# Patient Record
Sex: Female | Born: 1935 | Race: White | Hispanic: No | Marital: Single | Smoking: Former smoker
Health system: Southern US, Community
[De-identification: ages and names within clinical notes are randomized; demographics above are authoritative.]

## PROBLEM LIST (undated history)

## (undated) DIAGNOSIS — E559 Vitamin D deficiency, unspecified: Secondary | ICD-10-CM

## (undated) DIAGNOSIS — F0390 Unspecified dementia without behavioral disturbance: Secondary | ICD-10-CM

## (undated) DIAGNOSIS — M1990 Unspecified osteoarthritis, unspecified site: Secondary | ICD-10-CM

## (undated) DIAGNOSIS — F329 Major depressive disorder, single episode, unspecified: Secondary | ICD-10-CM

## (undated) DIAGNOSIS — Z96659 Presence of unspecified artificial knee joint: Secondary | ICD-10-CM

## (undated) DIAGNOSIS — N390 Urinary tract infection, site not specified: Secondary | ICD-10-CM

---

## 2012-08-10 ENCOUNTER — Other Ambulatory Visit: Payer: Self-pay | Admitting: Geriatric Medicine

## 2012-08-10 LAB — URINALYSIS, COMPLETE
Bilirubin,UR: NEGATIVE
Blood: NEGATIVE
Glucose,UR: NEGATIVE mg/dL (ref 0–75)
Ketone: NEGATIVE
Protein: NEGATIVE
RBC,UR: 1 /HPF (ref 0–5)
Specific Gravity: 1.021 (ref 1.003–1.030)
Squamous Epithelial: 1
WBC UR: 2 /HPF (ref 0–5)

## 2012-08-10 LAB — CBC WITH DIFFERENTIAL/PLATELET
HCT: 35 % (ref 35.0–47.0)
Lymphocyte #: 2.2 10*3/uL (ref 1.0–3.6)
Lymphocyte %: 18.6 %
MCH: 26.2 pg (ref 26.0–34.0)
MCHC: 30.8 g/dL — ABNORMAL LOW (ref 32.0–36.0)
MCV: 85 fL (ref 80–100)
Monocyte #: 1 x10 3/mm — ABNORMAL HIGH (ref 0.2–0.9)
Monocyte %: 8.2 %
Neutrophil %: 67.9 %
RDW: 18.7 % — ABNORMAL HIGH (ref 11.5–14.5)
WBC: 11.8 10*3/uL — ABNORMAL HIGH (ref 3.6–11.0)

## 2012-08-10 LAB — BASIC METABOLIC PANEL
Anion Gap: 12 (ref 7–16)
BUN: 29 mg/dL — ABNORMAL HIGH (ref 7–18)
Chloride: 105 mmol/L (ref 98–107)
Co2: 25 mmol/L (ref 21–32)
Creatinine: 0.8 mg/dL (ref 0.60–1.30)
EGFR (African American): >60
Glucose: 140 mg/dL — ABNORMAL HIGH (ref 65–99)
Osmolality: 291 (ref 275–301)

## 2012-08-14 LAB — URINE CULTURE

## 2014-09-26 ENCOUNTER — Other Ambulatory Visit: Payer: Self-pay | Admitting: Family Medicine

## 2015-01-08 ENCOUNTER — Other Ambulatory Visit
Admission: RE | Admit: 2015-01-08 | Discharge: 2015-01-08 | Disposition: A | Discharge status: Home / Self Care | Payer: Medicare Other | Source: Other Acute Inpatient Hospital | Attending: Family Medicine | Admitting: Family Medicine

## 2015-01-08 DIAGNOSIS — R079 Chest pain, unspecified: Secondary | ICD-10-CM | POA: Diagnosis present

## 2015-01-08 LAB — COMPREHENSIVE METABOLIC PANEL
ALBUMIN: 3.4 g/dL — AB (ref 3.5–5.0)
ALK PHOS: 106 U/L (ref 38–126)
ALT: 10 U/L — ABNORMAL LOW (ref 14–54)
AST: 22 U/L (ref 15–41)
Anion gap: 12 (ref 5–15)
BILIRUBIN TOTAL: 0.8 mg/dL (ref 0.3–1.2)
BUN: 32 mg/dL — AB (ref 6–20)
CO2: 25 mmol/L (ref 22–32)
CREATININE: 0.85 mg/dL (ref 0.44–1.00)
Calcium: 9.1 mg/dL (ref 8.9–10.3)
Chloride: 108 mmol/L (ref 101–111)
GFR calc Af Amer: >60 mL/min (ref >60)
GFR calc non Af Amer: 59 mL/min — ABNORMAL LOW (ref >60)
Glucose, Bld: 207 mg/dL — ABNORMAL HIGH (ref 65–99)
POTASSIUM: 2.7 mmol/L — AB (ref 3.5–5.1)
Sodium: 145 mmol/L (ref 135–145)
Total Protein: 7 g/dL (ref 6.5–8.1)

## 2015-01-08 LAB — URINALYSIS COMPLETE WITH MICROSCOPIC (ARMC ONLY)
Bilirubin Urine: NEGATIVE
GLUCOSE, UA: 50 mg/dL — AB
Ketones, ur: NEGATIVE mg/dL
Nitrite: NEGATIVE
PH: 6 (ref 5.0–8.0)
Protein, ur: 100 mg/dL — AB
SPECIFIC GRAVITY, URINE: 1.028 (ref 1.005–1.030)
SQUAMOUS EPITHELIAL / LPF: NONE SEEN

## 2015-01-08 LAB — CBC WITH DIFFERENTIAL/PLATELET
Basophils Absolute: 0 10*3/uL (ref 0–0.1)
Basophils Relative: 0 %
EOS PCT: 0 %
Eosinophils Absolute: 0 10*3/uL (ref 0–0.7)
HEMATOCRIT: 40.5 % (ref 35.0–47.0)
HEMOGLOBIN: 13.1 g/dL (ref 12.0–16.0)
Lymphocytes Relative: 5 %
Lymphs Abs: 0.7 10*3/uL — ABNORMAL LOW (ref 1.0–3.6)
MCH: 28.6 pg (ref 26.0–34.0)
MCHC: 32.4 g/dL (ref 32.0–36.0)
MCV: 88.4 fL (ref 80.0–100.0)
MONO ABS: 0.5 10*3/uL (ref 0.2–0.9)
Monocytes Relative: 3 %
NEUTROS PCT: 92 %
Neutro Abs: 14.1 10*3/uL — ABNORMAL HIGH (ref 1.4–6.5)
Platelets: 246 10*3/uL (ref 150–440)
RBC: 4.58 MIL/uL (ref 3.80–5.20)
RDW: 16.1 % — ABNORMAL HIGH (ref 11.5–14.5)
WBC: 15.4 10*3/uL — ABNORMAL HIGH (ref 3.6–11.0)

## 2015-01-08 LAB — CK: Total CK: 24 U/L — ABNORMAL LOW (ref 38–234)

## 2015-01-08 LAB — CKMB (ARMC ONLY): CK, MB: 12.6 ng/mL — AB (ref 0.5–5.0)

## 2015-01-08 LAB — TROPONIN I: Troponin I: 0.12 ng/mL — ABNORMAL HIGH (ref <0.031)

## 2015-01-09 ENCOUNTER — Other Ambulatory Visit
Admission: RE | Admit: 2015-01-09 | Discharge: 2015-01-09 | Disposition: A | Discharge status: Home / Self Care | Payer: Medicare Other | Source: Ambulatory Visit | Attending: Family Medicine | Admitting: Family Medicine

## 2015-01-09 DIAGNOSIS — R079 Chest pain, unspecified: Secondary | ICD-10-CM | POA: Insufficient documentation

## 2015-01-09 LAB — COMPREHENSIVE METABOLIC PANEL
ALBUMIN: 3 g/dL — AB (ref 3.5–5.0)
ALK PHOS: 97 U/L (ref 38–126)
ALT: 9 U/L — AB (ref 14–54)
ANION GAP: 12 (ref 5–15)
AST: 18 U/L (ref 15–41)
BUN: 29 mg/dL — AB (ref 6–20)
CHLORIDE: 111 mmol/L (ref 101–111)
CO2: 24 mmol/L (ref 22–32)
Calcium: 8.8 mg/dL — ABNORMAL LOW (ref 8.9–10.3)
Creatinine, Ser: 0.72 mg/dL (ref 0.44–1.00)
GFR calc Af Amer: >60 mL/min (ref >60)
GFR calc non Af Amer: >60 mL/min (ref >60)
GLUCOSE: 152 mg/dL — AB (ref 65–99)
Potassium: 3.1 mmol/L — ABNORMAL LOW (ref 3.5–5.1)
Sodium: 147 mmol/L — ABNORMAL HIGH (ref 135–145)
TOTAL PROTEIN: 6.5 g/dL (ref 6.5–8.1)
Total Bilirubin: 0.8 mg/dL (ref 0.3–1.2)

## 2015-01-09 LAB — CBC WITH DIFFERENTIAL/PLATELET
Basophils Absolute: 0 10*3/uL (ref 0–0.1)
Basophils Relative: 0 %
EOS PCT: 0 %
Eosinophils Absolute: 0 10*3/uL (ref 0–0.7)
HCT: 39.1 % (ref 35.0–47.0)
Hemoglobin: 12.7 g/dL (ref 12.0–16.0)
Lymphocytes Relative: 8 %
Lymphs Abs: 1.2 10*3/uL (ref 1.0–3.6)
MCH: 28.6 pg (ref 26.0–34.0)
MCHC: 32.6 g/dL (ref 32.0–36.0)
MCV: 87.6 fL (ref 80.0–100.0)
MONO ABS: 0.8 10*3/uL (ref 0.2–0.9)
Monocytes Relative: 5 %
NEUTROS PCT: 87 %
Neutro Abs: 13.7 10*3/uL — ABNORMAL HIGH (ref 1.4–6.5)
PLATELETS: 272 10*3/uL (ref 150–440)
RBC: 4.46 MIL/uL (ref 3.80–5.20)
RDW: 16 % — AB (ref 11.5–14.5)
WBC: 15.7 10*3/uL — AB (ref 3.6–11.0)

## 2015-01-09 LAB — TROPONIN I: TROPONIN I: 0.1 ng/mL — AB (ref <0.031)

## 2015-01-14 LAB — URINE CULTURE: Culture: >=100000

## 2015-09-19 ENCOUNTER — Inpatient Hospital Stay
Admission: EM | Admit: 2015-09-19 | Discharge: 2015-09-24 | DRG: 871 | Disposition: A | Discharge status: Hospice | Payer: Medicare Other | Attending: Internal Medicine | Admitting: Internal Medicine

## 2015-09-19 ENCOUNTER — Inpatient Hospital Stay: Payer: Medicare Other

## 2015-09-19 ENCOUNTER — Emergency Department: Payer: Medicare Other

## 2015-09-19 DIAGNOSIS — Z7401 Bed confinement status: Secondary | ICD-10-CM | POA: Diagnosis not present

## 2015-09-19 DIAGNOSIS — G92 Toxic encephalopathy: Secondary | ICD-10-CM | POA: Diagnosis present

## 2015-09-19 DIAGNOSIS — R4182 Altered mental status, unspecified: Secondary | ICD-10-CM | POA: Insufficient documentation

## 2015-09-19 DIAGNOSIS — N170 Acute kidney failure with tubular necrosis: Secondary | ICD-10-CM | POA: Diagnosis present

## 2015-09-19 DIAGNOSIS — F329 Major depressive disorder, single episode, unspecified: Secondary | ICD-10-CM | POA: Diagnosis present

## 2015-09-19 DIAGNOSIS — E860 Dehydration: Secondary | ICD-10-CM | POA: Diagnosis present

## 2015-09-19 DIAGNOSIS — E876 Hypokalemia: Secondary | ICD-10-CM | POA: Diagnosis present

## 2015-09-19 DIAGNOSIS — J690 Pneumonitis due to inhalation of food and vomit: Secondary | ICD-10-CM | POA: Diagnosis present

## 2015-09-19 DIAGNOSIS — E43 Unspecified severe protein-calorie malnutrition: Secondary | ICD-10-CM | POA: Diagnosis present

## 2015-09-19 DIAGNOSIS — L8990 Pressure ulcer of unspecified site, unspecified stage: Secondary | ICD-10-CM | POA: Insufficient documentation

## 2015-09-19 DIAGNOSIS — X58XXXA Exposure to other specified factors, initial encounter: Secondary | ICD-10-CM | POA: Diagnosis present

## 2015-09-19 DIAGNOSIS — A419 Sepsis, unspecified organism: Secondary | ICD-10-CM | POA: Diagnosis present

## 2015-09-19 DIAGNOSIS — Z6825 Body mass index (BMI) 25.0-25.9, adult: Secondary | ICD-10-CM

## 2015-09-19 DIAGNOSIS — Z515 Encounter for palliative care: Secondary | ICD-10-CM | POA: Diagnosis not present

## 2015-09-19 DIAGNOSIS — I248 Other forms of acute ischemic heart disease: Secondary | ICD-10-CM | POA: Diagnosis present

## 2015-09-19 DIAGNOSIS — F0390 Unspecified dementia without behavioral disturbance: Secondary | ICD-10-CM | POA: Diagnosis present

## 2015-09-19 DIAGNOSIS — I4891 Unspecified atrial fibrillation: Secondary | ICD-10-CM | POA: Diagnosis not present

## 2015-09-19 DIAGNOSIS — Z87440 Personal history of urinary (tract) infections: Secondary | ICD-10-CM | POA: Diagnosis not present

## 2015-09-19 DIAGNOSIS — Z66 Do not resuscitate: Secondary | ICD-10-CM | POA: Diagnosis present

## 2015-09-19 DIAGNOSIS — E870 Hyperosmolality and hypernatremia: Secondary | ICD-10-CM | POA: Diagnosis present

## 2015-09-19 DIAGNOSIS — R1310 Dysphagia, unspecified: Secondary | ICD-10-CM | POA: Diagnosis present

## 2015-09-19 DIAGNOSIS — R609 Edema, unspecified: Secondary | ICD-10-CM

## 2015-09-19 DIAGNOSIS — J189 Pneumonia, unspecified organism: Secondary | ICD-10-CM

## 2015-09-19 DIAGNOSIS — Y929 Unspecified place or not applicable: Secondary | ICD-10-CM

## 2015-09-19 DIAGNOSIS — Z882 Allergy status to sulfonamides status: Secondary | ICD-10-CM

## 2015-09-19 DIAGNOSIS — Z96651 Presence of right artificial knee joint: Secondary | ICD-10-CM | POA: Diagnosis present

## 2015-09-19 DIAGNOSIS — Z886 Allergy status to analgesic agent status: Secondary | ICD-10-CM

## 2015-09-19 DIAGNOSIS — Z87891 Personal history of nicotine dependence: Secondary | ICD-10-CM

## 2015-09-19 DIAGNOSIS — I480 Paroxysmal atrial fibrillation: Secondary | ICD-10-CM | POA: Diagnosis present

## 2015-09-19 DIAGNOSIS — J9601 Acute respiratory failure with hypoxia: Secondary | ICD-10-CM | POA: Diagnosis present

## 2015-09-19 DIAGNOSIS — R6520 Severe sepsis without septic shock: Secondary | ICD-10-CM | POA: Diagnosis present

## 2015-09-19 DIAGNOSIS — S60221A Contusion of right hand, initial encounter: Secondary | ICD-10-CM | POA: Diagnosis present

## 2015-09-19 HISTORY — DX: Unspecified osteoarthritis, unspecified site: M19.90

## 2015-09-19 HISTORY — DX: Unspecified dementia, unspecified severity, without behavioral disturbance, psychotic disturbance, mood disturbance, and anxiety: F03.90

## 2015-09-19 HISTORY — DX: Urinary tract infection, site not specified: N39.0

## 2015-09-19 HISTORY — DX: Major depressive disorder, single episode, unspecified: F32.9

## 2015-09-19 HISTORY — DX: Vitamin D deficiency, unspecified: E55.9

## 2015-09-19 HISTORY — DX: Presence of unspecified artificial knee joint: Z96.659

## 2015-09-19 LAB — RAPID INFLUENZA A&B ANTIGENS
Influenza A (ARMC): NEGATIVE
Influenza B (ARMC): NEGATIVE

## 2015-09-19 LAB — URINALYSIS COMPLETE WITH MICROSCOPIC (ARMC ONLY)
Bilirubin Urine: NEGATIVE
Glucose, UA: 50 mg/dL — AB
Leukocytes, UA: NEGATIVE
Nitrite: NEGATIVE
PH: 5 (ref 5.0–8.0)
Specific Gravity, Urine: 1.039 — ABNORMAL HIGH (ref 1.005–1.030)

## 2015-09-19 LAB — BLOOD GAS, VENOUS
ACID-BASE EXCESS: 7.5 mmol/L — AB (ref 0.0–3.0)
BICARBONATE: 32 meq/L — AB (ref 21.0–28.0)
FIO2: 100
O2 SAT: 88.4 %
PCO2 VEN: 43 mmHg — AB (ref 44.0–60.0)
PH VEN: 7.48 — AB (ref 7.320–7.430)
PO2 VEN: 51 mmHg — AB (ref 31.0–45.0)
Patient temperature: 37

## 2015-09-19 LAB — CBC
HCT: 42.4 % (ref 35.0–47.0)
Hemoglobin: 14 g/dL (ref 12.0–16.0)
MCH: 27.8 pg (ref 26.0–34.0)
MCHC: 33.2 g/dL (ref 32.0–36.0)
MCV: 83.7 fL (ref 80.0–100.0)
Platelets: 284 10*3/uL (ref 150–440)
RBC: 5.06 MIL/uL (ref 3.80–5.20)
RDW: 18 % — AB (ref 11.5–14.5)
WBC: 18.4 10*3/uL — AB (ref 3.6–11.0)

## 2015-09-19 LAB — COMPREHENSIVE METABOLIC PANEL
ALBUMIN: 2.7 g/dL — AB (ref 3.5–5.0)
ALT: 15 U/L (ref 14–54)
AST: 27 U/L (ref 15–41)
Alkaline Phosphatase: 124 U/L (ref 38–126)
Anion gap: 13 (ref 5–15)
BUN: 49 mg/dL — ABNORMAL HIGH (ref 6–20)
CALCIUM: 9.1 mg/dL (ref 8.9–10.3)
CO2: 30 mmol/L (ref 22–32)
Chloride: 113 mmol/L — ABNORMAL HIGH (ref 101–111)
Creatinine, Ser: 1.1 mg/dL — ABNORMAL HIGH (ref 0.44–1.00)
GFR, EST AFRICAN AMERICAN: 50 mL/min — AB (ref >60)
GFR, EST NON AFRICAN AMERICAN: 43 mL/min — AB (ref >60)
Glucose, Bld: 198 mg/dL — ABNORMAL HIGH (ref 65–99)
POTASSIUM: 3.1 mmol/L — AB (ref 3.5–5.1)
Sodium: 156 mmol/L — ABNORMAL HIGH (ref 135–145)
TOTAL PROTEIN: 7.1 g/dL (ref 6.5–8.1)
Total Bilirubin: 1.1 mg/dL (ref 0.3–1.2)

## 2015-09-19 LAB — MRSA PCR SCREENING: MRSA by PCR: NEGATIVE

## 2015-09-19 LAB — TROPONIN I
Troponin I: 0.21 ng/mL — ABNORMAL HIGH (ref <0.031)
Troponin I: 0.19 ng/mL — ABNORMAL HIGH (ref <0.031)
Troponin I: 0.26 ng/mL — ABNORMAL HIGH (ref <0.031)

## 2015-09-19 LAB — LACTIC ACID, PLASMA
LACTIC ACID, VENOUS: 2.5 mmol/L — AB (ref 0.5–2.0)
Lactic Acid, Venous: 2.7 mmol/L (ref 0.5–2.0)

## 2015-09-19 MED ORDER — ONDANSETRON HCL 4 MG/2ML IJ SOLN: 4.0000 mg | Freq: Four times a day (QID) | INTRAMUSCULAR | Status: DC | PRN

## 2015-09-19 MED ORDER — ACETAMINOPHEN 325 MG PO TABS
650.0000 mg | ORAL_TABLET | Freq: Four times a day (QID) | ORAL | Status: DC | PRN
Start: 2015-09-19 — End: 2015-09-24
  Administered 2015-09-21: 650 mg via ORAL
  Filled 2015-09-19: qty 2

## 2015-09-19 MED ORDER — SODIUM CHLORIDE 0.9 % IV BOLUS (SEPSIS)
1000.0000 mL | INTRAVENOUS | Status: AC
  Administered 2015-09-19: 1000 mL via INTRAVENOUS

## 2015-09-19 MED ORDER — VANCOMYCIN HCL IN DEXTROSE 1-5 GM/200ML-% IV SOLN
1000.0000 mg | Freq: Once | INTRAVENOUS | Status: AC
  Administered 2015-09-19: 1000 mg via INTRAVENOUS
  Filled 2015-09-19: qty 200

## 2015-09-19 MED ORDER — DEXTROSE 5 % IV SOLN
2.0000 g | INTRAVENOUS | Status: DC
  Administered 2015-09-19 – 2015-09-20 (×2): 2 g via INTRAVENOUS
  Filled 2015-09-19 (×3): qty 2

## 2015-09-19 MED ORDER — VANCOMYCIN HCL IN DEXTROSE 750-5 MG/150ML-% IV SOLN
750.0000 mg | INTRAVENOUS | Status: DC
  Administered 2015-09-19 – 2015-09-20 (×2): 750 mg via INTRAVENOUS
  Filled 2015-09-19 (×3): qty 150

## 2015-09-19 MED ORDER — ACETAMINOPHEN 650 MG RE SUPP
650.0000 mg | Freq: Four times a day (QID) | RECTAL | Status: DC | PRN
  Administered 2015-09-19: 650 mg via RECTAL
  Filled 2015-09-19: qty 1

## 2015-09-19 MED ORDER — HEPARIN SODIUM (PORCINE) 5000 UNIT/ML IJ SOLN
5000.0000 [IU] | Freq: Three times a day (TID) | INTRAMUSCULAR | Status: DC
  Administered 2015-09-19 – 2015-09-21 (×5): 5000 [IU] via SUBCUTANEOUS
  Filled 2015-09-19 (×4): qty 1

## 2015-09-19 MED ORDER — ONDANSETRON HCL 4 MG PO TABS: 4.0000 mg | ORAL_TABLET | Freq: Four times a day (QID) | ORAL | Status: DC | PRN

## 2015-09-19 MED ORDER — CETYLPYRIDINIUM CHLORIDE 0.05 % MT LIQD
7.0000 mL | Freq: Two times a day (BID) | OROMUCOSAL | Status: DC
  Administered 2015-09-20 – 2015-09-23 (×6): 7 mL via OROMUCOSAL

## 2015-09-19 MED ORDER — PIPERACILLIN-TAZOBACTAM 3.375 G IVPB 30 MIN
3.3750 g | Freq: Once | INTRAVENOUS | Status: AC
  Administered 2015-09-19: 3.375 g via INTRAVENOUS
  Filled 2015-09-19: qty 50

## 2015-09-19 MED ORDER — SODIUM CHLORIDE 0.9 % IV BOLUS (SEPSIS)
1000.0000 mL | Freq: Once | INTRAVENOUS | Status: AC
  Administered 2015-09-19: 1000 mL via INTRAVENOUS

## 2015-09-19 MED ORDER — POTASSIUM CL IN DEXTROSE 5% 20 MEQ/L IV SOLN
20.0000 meq | INTRAVENOUS | Status: DC
  Administered 2015-09-19 – 2015-09-20 (×3): 20 meq via INTRAVENOUS
  Filled 2015-09-19 (×7): qty 1000

## 2015-09-19 MED ORDER — CHLORHEXIDINE GLUCONATE 0.12 % MT SOLN
15.0000 mL | Freq: Two times a day (BID) | OROMUCOSAL | Status: DC
  Administered 2015-09-19 – 2015-09-23 (×7): 15 mL via OROMUCOSAL
  Filled 2015-09-19 (×5): qty 15

## 2015-09-19 NOTE — Progress Notes (Signed)
eLink Physician-Brief Progress Note Patient Name: Delray Altnita M Pittsley DOB: 22-Apr-1925 MRN: [id number]   Date of Service  09/19/2015  HPI/Events of Note  Lactic Acid = 2.5.  eICU Interventions  Will bolus with 0.9 NaCl 1 liter IV over 1 hour now.      Intervention Category Major Interventions: Acid-Base disturbance - evaluation and management  Aidric Endicott Eugene 09/19/2015, 7:59 PM

## 2015-09-19 NOTE — ED Notes (Signed)
Pt came to ED from [facility]. Altered mental status since this morning. Staff at [facility] reports pt has dementia but is normally alert and able to talk. Pt has strong smell of ammonia and staff reports she has frequent UTIs.

## 2015-09-19 NOTE — ED Notes (Signed)
Pts family called this RN into the room. Pts family reported that they forgot that the patient was allergic to penicillin. MD notified.

## 2015-09-19 NOTE — ED Provider Notes (Signed)
[facility] Emergency Department Provider Note  ____________________________________________  Time seen: On arrival  I have reviewed the triage vital signs and the nursing notes.   HISTORY  Chief Complaint Shortness of Breath  History severely limited due to dementia  HPI Destiny Gutierrez is a 82 y.o. female who presents with reported shortness of breath and apparent altered mental status. She is from [facility]. Apparently at baseline she is alert and talkative. Today she is not responding and is ill-appearing     Past Medical History  Diagnosis Date  . Dementia   . Vitamin D deficiency   . MDD (major depressive disorder) (HCC)   . UTI (lower urinary tract infection)     There are no active problems to display for this patient.   No past surgical history on file.  Current Outpatient Rx  Name  Route  Sig  Dispense  Refill  . acetaminophen (TYLENOL) 325 MG tablet   Oral   Take 650 mg by mouth every 6 (six) hours as needed.         . Eyelid Cleansers (SYSTANE LID WIPES EX)   Apply externally   Apply 1 application topically 2 (two) times daily.         Marland Kitchen. FLUoxetine (PROZAC) 10 MG capsule   Oral   Take 10 mg by mouth daily.         Marland Kitchen. ipratropium-albuterol (DUONEB) 0.5-2.5 (3) MG/3ML SOLN   Nebulization   Take 3 mLs by nebulization every 6 (six) hours.         Marland Kitchen. loratadine (CLARITIN) 10 MG tablet   Oral   Take 10 mg by mouth daily.         . Melatonin 3 MG TABS   Oral   Take 3 mg by mouth at bedtime.         Marland Kitchen. tobramycin (TOBREX) 0.3 % ophthalmic solution      1 drop every 4 (four) hours as needed.         . tobramycin-dexamethasone (TOBRADEX) ophthalmic ointment      1 application every 12 (twelve) hours as needed.           Allergies Codeine; Oxycodone; and Sulfa antibiotics  No family history on file.  Social History Social History  Substance Use Topics  . Smoking status: Unknown If Ever Smoked  .  Smokeless tobacco: None  . Alcohol Use: No    Level V caveat: Unable to obtain Review of Systems due to altered mental status      ____________________________________________   PHYSICAL EXAM:  VITAL SIGNS: BP 130/87 mmHg  Pulse 31  Temp(Src) 101.5 F (38.6 C)  Resp 27  Ht 5\' 6"  (1.676 m)  Wt 63.7 kg  BMI 22.68 kg/m2  SpO2 95%                                                           --     Constitutional: Withdraws from painful stimuli. Ill-appearing Eyes: PERRLA ENT   Head: Normocephalic and atraumatic.   Mouth/Throat: Mucous membranes are dry Cardiovascular: Tachycardic, regular rhythm. Normal and symmetric distal pulses are present in all extremities.  Respiratory: Increased respiratory effort. Rales bilaterally Gastrointestinal: No distention. There is no CVA tenderness. Genitourinary: deferred Musculoskeletal:  Bilateral lower extremity  edema, mild Neurologic: Nonresponsive to questions Skin:  Skin is warm, dry and intact. No rash noted. Psychiatric: Unable to examine  ____________________________________________    LABS (pertinent positives/negatives)  Labs Reviewed  CBC - Abnormal; Notable for the following:    WBC 18.4 (*)    RDW 18.0 (*)    All other components within normal limits  COMPREHENSIVE METABOLIC PANEL - Abnormal; Notable for the following:    Sodium 156 (*)    Potassium 3.1 (*)    Chloride 113 (*)    Glucose, Bld 198 (*)    BUN 49 (*)    Creatinine, Ser 1.10 (*)    Albumin 2.7 (*)    GFR calc non Af Amer 43 (*)    GFR calc Af Amer 50 (*)    All other components within normal limits  TROPONIN I - Abnormal; Notable for the following:    Troponin I 0.21 (*)    All other components within normal limits  LACTIC ACID, PLASMA - Abnormal; Notable for the following:    Lactic Acid, Venous 2.7 (*)    All other components within normal limits  URINALYSIS COMPLETEWITH MICROSCOPIC ([facility] ONLY) - Abnormal; Notable for  the following:    Color, Urine AMBER (*)    APPearance HAZY (*)    Glucose, UA 50 (*)    Ketones, ur TRACE (*)    Specific Gravity, Urine 1.039 (*)    Hgb urine dipstick 1+ (*)    Protein, ur >500 (*)    Bacteria, UA RARE (*)    Squamous Epithelial / LPF 0-5 (*)    All other components within normal limits  BLOOD GAS, VENOUS - Abnormal; Notable for the following:    pH, Ven 7.48 (*)    pCO2, Ven 43 (*)    pO2, Ven 51.0 (*)    Bicarbonate 32.0 (*)    Acid-Base Excess 7.5 (*)    All other components within normal limits  RAPID INFLUENZA A&B ANTIGENS ([facility] ONLY)  CULTURE, BLOOD (ROUTINE X 2)  CULTURE, BLOOD (ROUTINE X 2)  URINE CULTURE  LACTIC ACID, PLASMA    ____________________________________________   EKG  ED ECG REPORT I, Jene Every, the attending physician, personally viewed and interpreted this ECG.   Date: 09/19/2015  EKG Time: 2:43 PM  Rate: 109  Rhythm: sinus tachycardia  Axis: Right axis deviation  Intervals:right bundle branch block  ST&T Change: Nonspecific   ____________________________________________    RADIOLOGY I have personally reviewed any xrays that were ordered on this patient: Chest x-ray concerning for right middle lobe pneumonia  ____________________________________________   PROCEDURES  Procedure(s) performed: none  Critical Care performed: yes CRITICAL CARE Performed by: Jene Every   Total critical care time: 30 minutes  Critical care time was exclusive of separately billable procedures and treating other patients.  Critical care was necessary to treat or prevent imminent or life-threatening deterioration.  Critical care was time spent personally by me on the following activities: development of treatment plan with patient and/or surrogate as well as nursing, discussions with consultants, evaluation of patient's response to treatment, examination of patient, obtaining history from patient or surrogate, ordering and  performing treatments and interventions, ordering and review of laboratory studies, ordering and review of radiographic studies, pulse oximetry and re-evaluation of patient's condition.   ____________________________________________   INITIAL IMPRESSION / ASSESSMENT AND PLAN / ED COURSE  Pertinent labs & imaging results that were available during my care of the patient were reviewed by me and  considered in my medical decision making (see chart for details).  Patient has a MOST form however apparently the family has instructed [facility] to send the patient to the ED for "treatment". As such we will obtain labs and a chest x-ray and urinalysis and reevaluate. My sense is that patient is likely septic but unclear how aggressive to be.  ----------------------------------------- 2:47 PM on 09/19/2015 -----------------------------------------  Discussed with patient's daughter and POA, they note patient is DO NOT RESUSCITATE and DO NOT INTUBATE but they are okay with oxygen fluids and antibiotics. They also note the patient has a current femur fracture    Patient with elevated white blood cell count, hypernatremia, elevated lactic acid and x-ray suspicious for pneumonia. Her troponin is elevated at 0.21. She is receiving vancomycin and Zosyn for sepsis  ____________________________________________   FINAL CLINICAL IMPRESSION(S) / ED DIAGNOSES  Final diagnoses:  Sepsis, due to unspecified organism [facility])     Jene Every, MD 09/19/15 1545

## 2015-09-19 NOTE — Progress Notes (Signed)
   09/19/15 2200  Clinical Encounter Type  Visited With Patient and family together  Visit Type Initial;Spiritual support  Referral From Nurse  Spiritual Encounters  Spiritual Needs Prayer  Stress Factors  Patient Stress Factors Health changes  Family requested prayer with patient and family. Chaplain engaged family with prayer and support. Maisie Fushomas

## 2015-09-19 NOTE — H&P (Signed)
[facility] [place] at [facility]   PATIENT NAME: Destiny Gutierrez    MR#:  [id number]  DATE OF BIRTH:  1925-07-04  DATE OF ADMISSION:  09/19/2015  PRIMARY CARE PHYSICIAN: Dorothey BasemanAVID BRONSTEIN, MD   REQUESTING/REFERRING PHYSICIAN: Dr. Jene Everyobert Kinner  CHIEF COMPLAINT:   Chief Complaint  Patient presents with  . Shortness of Breath    HISTORY OF PRESENT ILLNESS:  Destiny Gutierrez  is a 82 y.o. female with a known history of dementia with bedbound status, history of depression, history of previous UTI, osteoarthritis, status post right knee replacement, recent fall with a right hip fracture treated nonoperatively who presents to the hospital due to altered mental status and a cough. Patient herself has baseline dementia and is currently altered and therefore most of the history obtained from the daughters at bedside and also from the chart. Patient was in her usual state of health until this morning she was noted to be more lethargic and altered and also noted to have a cough with upper airway rattling sounds are noted to be hypoxic. She was sent to the ER for further evaluation noted to be febrile, tachycardic, and noted to have an elevated lactic acid consistent with sepsis. Given her upper airway respiratory symptoms she was thought to have a pneumonia and hospitalist services were contacted further treatment and evaluation.  PAST MEDICAL HISTORY:   Past Medical History  Diagnosis Date  . Dementia   . Vitamin D deficiency   . MDD (major depressive disorder) (HCC)   . UTI (lower urinary tract infection)   . Osteoarthritis   . Knee joint replacement status     PAST SURGICAL HISTORY:  No past surgical history on file.  SOCIAL HISTORY:   Social History  Substance Use Topics  . Smoking status: Former Smoker -- 1.00 packs/day for 20 years    Types: Cigarettes  . Smokeless tobacco: Not on file  . Alcohol Use: No    FAMILY HISTORY:   Family History  Problem  Relation Age of Onset  . Dementia Mother   . Alcoholism Father     DRUG ALLERGIES:   Allergies  Allergen Reactions  . Codeine   . Oxycodone   . Sulfa Antibiotics     REVIEW OF SYSTEMS:   Review of Systems  Unable to perform ROS: dementia    MEDICATIONS AT HOME:   Prior to Admission medications   Medication Sig Start Date End Date Taking? Authorizing Provider  acetaminophen (TYLENOL) 325 MG tablet Take 650 mg by mouth every 6 (six) hours as needed.   Yes Historical Provider, MD  Eyelid Cleansers (SYSTANE LID WIPES EX) Apply 1 application topically 2 (two) times daily.   Yes Historical Provider, MD  FLUoxetine (PROZAC) 10 MG capsule Take 10 mg by mouth daily.   Yes Historical Provider, MD  ipratropium-albuterol (DUONEB) 0.5-2.5 (3) MG/3ML SOLN Take 3 mLs by nebulization every 6 (six) hours.   Yes Historical Provider, MD  loratadine (CLARITIN) 10 MG tablet Take 10 mg by mouth daily.   Yes Historical Provider, MD  Melatonin 3 MG TABS Take 3 mg by mouth at bedtime.   Yes Historical Provider, MD  tobramycin (TOBREX) 0.3 % ophthalmic solution 1 drop every 4 (four) hours as needed.   Yes Historical Provider, MD  tobramycin-dexamethasone [facility](TOBRADEX) ophthalmic ointment 1 application every 12 (twelve) hours as needed.   Yes Historical Provider, MD      VITAL SIGNS:  Blood pressure 130/87, pulse 31, temperature 101.5  F (38.6 C), resp. rate 27, height  (1.676 m), weight 63.7 kg (140 lb 6.9 oz), SpO2 95 %.  PHYSICAL EXAMINATION:  Physical Exam  GENERAL:  82 y.o.-year-old patient lying in the bed encephalopathic and in mild respiratory distress.  EYES: Pupils equal, round, reactive to light. No scleral icterus. HEENT: Head atraumatic, normocephalic.No oropharyngeal erythema, Dry oral mucosa  NECK:  Supple, no jugular venous distention. No thyroid enlargement, no tenderness.  LUNGS: Bilateral upper airway rhonchi, positive accessory muscles. No dullness to percussion. No wheezing,  rales. CARDIOVASCULAR: S1, S2 RRR. No murmurs, rubs, gallops, clicks.  ABDOMEN: Soft, nontender, nondistended. Bowel sounds present. No organomegaly or mass.  EXTREMITIES: No pedal edema, cyanosis, or clubbing. + 2 pedal & radial pulses b/l.   NEUROLOGIC: Globally weak and encephalopathic. Difficult to do a full neurological exam presently. PSYCHIATRIC: Globally weak and encephalopathic.  SKIN: No obvious rash, lesion, or ulcer.   LABORATORY PANEL:   CBC  Recent Labs Lab 09/19/15 1430  WBC 18.4*  HGB 14.0  HCT 42.4  PLT 284   ------------------------------------------------------------------------------------------------------------------  Chemistries   Recent Labs Lab 09/19/15 1430  NA 156*  K 3.1*  CL 113*  CO2 30  GLUCOSE 198*  BUN 49*  CREATININE 1.10*  CALCIUM 9.1  AST 27  ALT 15  ALKPHOS 124  BILITOT 1.1   ------------------------------------------------------------------------------------------------------------------  Cardiac Enzymes  Recent Labs Lab 09/19/15 1430  TROPONINI 0.21*   ------------------------------------------------------------------------------------------------------------------  RADIOLOGY:  Dg Chest Portable 1 View  09/19/2015  CLINICAL DATA:  Altered mental status. EXAM: PORTABLE CHEST 1 VIEW COMPARISON:  None FINDINGS: No pneumothorax. The heart, hila, and mediastinum are unremarkable given portable technique. No pulmonary nodules, masses, or infiltrates. IMPRESSION: No active disease. Electronically Signed   By: Gerome Sam III M.D   On: 09/19/2015 15:49     IMPRESSION AND PLAN:   82 year old female with past medical history of dementia, osteoarthritis, history of right knee replacement, who presents to the hospital due to altered mental status and in acute respiratory failure.  #1 altered mental status-metabolic encephalopathy secondary to the sepsis, hypernatremia -I will treat underlying sepsis with IV antibiotics,  correct sodium with D5W. Follow mental status. -I will get a CT head.  #2 sepsis-patient meets criteria given elevated lactic acid, tachycardia, and upper airway respiratory symptoms consistent with pneumonia. -IV fluids, broad-spectrum IV antibiotics with vancomycin and Zosyn. Follow blood, sputum cultures.  #3 pneumonia-cause of patient's sepsis. -Suspected aspiration pneumonia. Continue vancomycin, Zosyn. Follow sputum cultures. Speech eval when mental status is improved.  #4 hypernatremia - due to dehydration and poor by mouth intake. -I will start patient D5W and follow sodium.  #5 acute renal failure-due to dehydration and poor by mouth intake and also underlying sepsis. -Hydrate with IV fluids, follow BUN/creatinine.  #6 leukocytosis-due to underlying infection. Follow white cell count with IV antibiotic therapy.  Patient's prognosis is guarded/poor discussed this with the patient's family at bedside. If patient is not improving would consider getting a palliative care consult.  All the records are reviewed and case discussed with ED provider. Management plans discussed with the patient, family and they are in agreement.  CODE STATUS: DNR  TOTAL TIME TAKING CARE OF THIS PATIENT: 50 minutes.    Houston Siren M.D on 09/19/2015 at 4:21 PM  Between 7am to 6pm - Pager - [phone]  After 6pm go to [email or web address] - password [email or web address] [facility]  [facility]  Office  [phone]  CC: Primary care physician; Dorothey Baseman, MD

## 2015-09-19 NOTE — ED Notes (Signed)
Pt taken off non rebreather and placed on 4 L [place] per MD.

## 2015-09-19 NOTE — Progress Notes (Signed)
Pharmacy Antibiotic Note  Destiny Gutierrez is a 82 y.o. female admitted on 09/19/2015 with sepsis.  Pharmacy has been consulted for vancomycin & cefepime dosing. Patient received one dose of vancomycin and piperacillin/tazobactam in the ED.   Plan: Vancomycin 750 IV every 24 hours.  Goal trough 15-20 mcg/mL. Patient received 1000 mg dose in ED. Stacked dose to begin 8 hour after initial dose.  Vancomycin trough scheduled for 3/13 at 2230, which is prior to the 4th dose.   Kinetics: Ke: 0.030 Half-life: 23 hours Vd: 47 L Cmin ~15.5 mcg/mL calculated estimate  Cefepime 2 g IV daily per renal function and indication  Height: 5\' 5"  (165.1 cm) Weight: 148 lb 2.4 oz (67.2 kg) IBW/kg (Calculated) : 57  Temp (24hrs), Avg:101.1 F (38.4 C), Min:100.5 F (38.1 C), Max:101.5 F (38.6 C)   Recent Labs Lab 09/19/15 1430 09/19/15 1726  WBC 18.4*  --   CREATININE 1.10*  --   LATICACIDVEN 2.7* 2.5*    Estimated Creatinine Clearance: 31.2 mL/min (by C-G formula based on Cr of 1.1).    Allergies  Allergen Reactions  . Codeine   . Oxycodone   . Penicillins   . Sulfa Antibiotics     Antimicrobials this admission: Vancomycin 3/11 >>  cefepime 3/11 >>   Dose adjustments this admission: n/a  Microbiology results: 3/11 BCx: Sent 3/11 UCx: Sent from catheter  3/11 MRSA PCR: negative  Thank you for allowing pharmacy to be a part of this patient's care.  Destiny Gutierrez, PharmD Clinical Pharmacist 09/19/2015 7:22 PM

## 2015-09-19 NOTE — ED Notes (Signed)
Pt transported to CT ?

## 2015-09-19 NOTE — ED Notes (Signed)
Critical labs troponin 0.21 and lactic acid 2.7. MD notified.

## 2015-09-19 NOTE — Progress Notes (Signed)
Called Dr. Anne HahnWillis to report central tele monitoring called to report pt had a 12 beat run nonsustained VT, and troponin came back at 0.26. Will continue to monitor. Pt coughing on an off, non responsive. Family at bedside. Just received a second liter of NS. Had first in ED.

## 2015-09-19 NOTE — Progress Notes (Signed)
Notified E Link about patient's second lactic acid of 2.5 (first 2.7). E Link RN to notify [place]ade NurseE Link MD.

## 2015-09-20 ENCOUNTER — Inpatient Hospital Stay: Payer: Medicare Other

## 2015-09-20 ENCOUNTER — Inpatient Hospital Stay (HOSPITAL_COMMUNITY)
Admit: 2015-09-20 | Discharge: 2015-09-20 | Disposition: A | Discharge status: Home / Self Care | Payer: Medicare Other | Attending: Internal Medicine | Admitting: Internal Medicine

## 2015-09-20 DIAGNOSIS — L8990 Pressure ulcer of unspecified site, unspecified stage: Secondary | ICD-10-CM | POA: Insufficient documentation

## 2015-09-20 DIAGNOSIS — I4891 Unspecified atrial fibrillation: Secondary | ICD-10-CM

## 2015-09-20 LAB — BLOOD CULTURE ID PANEL (REFLEXED)
Acinetobacter baumannii: NOT DETECTED
CANDIDA ALBICANS: NOT DETECTED
CANDIDA GLABRATA: NOT DETECTED
CANDIDA KRUSEI: NOT DETECTED
CANDIDA PARAPSILOSIS: NOT DETECTED
CANDIDA TROPICALIS: NOT DETECTED
Carbapenem resistance: NOT DETECTED
ENTEROBACTER CLOACAE COMPLEX: NOT DETECTED
ESCHERICHIA COLI: NOT DETECTED
Enterobacteriaceae species: NOT DETECTED
Enterococcus species: NOT DETECTED
Haemophilus influenzae: NOT DETECTED
KLEBSIELLA PNEUMONIAE: NOT DETECTED
Klebsiella oxytoca: NOT DETECTED
Listeria monocytogenes: NOT DETECTED
Methicillin resistance: DETECTED — AB
Neisseria meningitidis: NOT DETECTED
PROTEUS SPECIES: NOT DETECTED
Pseudomonas aeruginosa: NOT DETECTED
SERRATIA MARCESCENS: NOT DETECTED
STREPTOCOCCUS PNEUMONIAE: NOT DETECTED
Staphylococcus aureus (BCID): NOT DETECTED
Staphylococcus species: DETECTED — AB
Streptococcus agalactiae: NOT DETECTED
Streptococcus pyogenes: NOT DETECTED
Streptococcus species: NOT DETECTED
Vancomycin resistance: NOT DETECTED

## 2015-09-20 LAB — URINALYSIS COMPLETE WITH MICROSCOPIC (ARMC ONLY)
BILIRUBIN URINE: NEGATIVE
GLUCOSE, UA: NEGATIVE mg/dL
Ketones, ur: NEGATIVE mg/dL
LEUKOCYTES UA: NEGATIVE
NITRITE: NEGATIVE
PH: 6 (ref 5.0–8.0)
Protein, ur: 100 mg/dL — AB
SPECIFIC GRAVITY, URINE: 1.028 (ref 1.005–1.030)
Squamous Epithelial / LPF: NONE SEEN

## 2015-09-20 LAB — BASIC METABOLIC PANEL
Anion gap: 9 (ref 5–15)
Anion gap: 7 (ref 5–15)
BUN: 41 mg/dL — ABNORMAL HIGH (ref 6–20)
BUN: 43 mg/dL — AB (ref 6–20)
CALCIUM: 8.7 mg/dL — AB (ref 8.9–10.3)
CHLORIDE: 115 mmol/L — AB (ref 101–111)
CHLORIDE: 116 mmol/L — AB (ref 101–111)
CO2: 27 mmol/L (ref 22–32)
CO2: 25 mmol/L (ref 22–32)
CREATININE: 0.62 mg/dL (ref 0.44–1.00)
CREATININE: 0.81 mg/dL (ref 0.44–1.00)
Calcium: 8.1 mg/dL — ABNORMAL LOW (ref 8.9–10.3)
GFR calc Af Amer: >60 mL/min (ref >60)
GFR calc non Af Amer: >60 mL/min (ref >60)
GFR calc non Af Amer: >60 mL/min (ref >60)
GLUCOSE: 173 mg/dL — AB (ref 65–99)
GLUCOSE: 236 mg/dL — AB (ref 65–99)
POTASSIUM: 2.8 mmol/L — AB (ref 3.5–5.1)
Potassium: 3.5 mmol/L (ref 3.5–5.1)
SODIUM: 150 mmol/L — AB (ref 135–145)
Sodium: 149 mmol/L — ABNORMAL HIGH (ref 135–145)

## 2015-09-20 LAB — CBC
HEMATOCRIT: 37.3 % (ref 35.0–47.0)
Hemoglobin: 12.3 g/dL (ref 12.0–16.0)
MCH: 27.8 pg (ref 26.0–34.0)
MCHC: 32.9 g/dL (ref 32.0–36.0)
MCV: 84.3 fL (ref 80.0–100.0)
PLATELETS: 212 10*3/uL (ref 150–440)
RBC: 4.42 MIL/uL (ref 3.80–5.20)
RDW: 18 % — AB (ref 11.5–14.5)
WBC: 17 10*3/uL — AB (ref 3.6–11.0)

## 2015-09-20 LAB — TROPONIN I: Troponin I: 0.15 ng/mL — ABNORMAL HIGH (ref <0.031)

## 2015-09-20 LAB — MAGNESIUM: Magnesium: 2.9 mg/dL — ABNORMAL HIGH (ref 1.7–2.4)

## 2015-09-20 MED ORDER — TOBRAMYCIN-DEXAMETHASONE 0.3-0.1 % OP OINT
TOPICAL_OINTMENT | Freq: Two times a day (BID) | OPHTHALMIC | Status: DC
  Administered 2015-09-20 – 2015-09-24 (×7): via OPHTHALMIC
  Filled 2015-09-20: qty 3.5

## 2015-09-20 MED ORDER — MAGNESIUM SULFATE 2 GM/50ML IV SOLN
2.0000 g | Freq: Once | INTRAVENOUS | Status: AC
  Administered 2015-09-20: 2 g via INTRAVENOUS
  Filled 2015-09-20: qty 50

## 2015-09-20 MED ORDER — SODIUM CHLORIDE 0.9 % IV SOLN
1.0000 g | Freq: Once | INTRAVENOUS | Status: AC
  Administered 2015-09-20: 1 g via INTRAVENOUS
  Filled 2015-09-20: qty 10

## 2015-09-20 MED ORDER — POTASSIUM CHLORIDE 10 MEQ/100ML IV SOLN
10.0000 meq | INTRAVENOUS | Status: AC
  Administered 2015-09-20 (×4): 10 meq via INTRAVENOUS
  Filled 2015-09-20 (×4): qty 100

## 2015-09-20 MED ORDER — TOBRAMYCIN 0.3 % OP SOLN
1.0000 [drp] | OPHTHALMIC | Status: DC
  Administered 2015-09-20 – 2015-09-23 (×19): 1 [drp] via OPHTHALMIC
  Filled 2015-09-20: qty 5

## 2015-09-20 MED ORDER — METOPROLOL TARTRATE 1 MG/ML IV SOLN
5.0000 mg | Freq: Four times a day (QID) | INTRAVENOUS | Status: DC
  Administered 2015-09-21 – 2015-09-22 (×6): 5 mg via INTRAVENOUS
  Filled 2015-09-20 (×7): qty 5

## 2015-09-20 NOTE — Progress Notes (Signed)
Mrs. Destiny Gutierrez opens her eyes when spoken to and nods her head yes or no. She is a little more responsive than she was this mornig. Pt weaned off no rebreather and placed on 5l nasal cannula. O2 sats in the mid 90's. Foley in place. Urinalysis collected as ordered. Daughters at bedside. Transferred to room 101. Report given to nurse. Remains on telemetry

## 2015-09-20 NOTE — Progress Notes (Signed)
Pharmacy Antibiotic Note  Destiny Gutierrez is a 82 y.o. female admitted on 09/19/2015 with sepsis.  Pharmacy has been consulted for vancomycin & cefepime dosing. This is day #2 of antibiotics.   Plan: BCID results were discussed with Dr. Nemiah CommanderKalisetti and the patient's current antibiotics will be continued. Patient was admitted with sepsis. Blood culture was positive with staph species in one bottle, which will likely result as CoNS. Patient is currently on vancomycin. Per discussion with MD, will continue current antibiotics.   Height: 5\' 5"  (165.1 cm) Weight: 148 lb 2.4 oz (67.2 kg) IBW/kg (Calculated) : 57  Temp (24hrs), Avg:100.1 F (37.8 C), Min:98.4 F (36.9 C), Max:101.5 F (38.6 C)   Recent Labs Lab 09/19/15 1430 09/19/15 1726 09/20/15 0024 09/20/15 1045  WBC 18.4*  --  17.0*  --   CREATININE 1.10*  --  0.81 0.62  LATICACIDVEN 2.7* 2.5*  --   --     Estimated Creatinine Clearance: 42.9 mL/min (by C-G formula based on Cr of 0.62).    Allergies  Allergen Reactions  . Codeine   . Oxycodone   . Penicillins   . Sulfa Antibiotics     Antimicrobials this admission: cefepime 3/11 >>  vancomycin 3/11 >>   Dose adjustments this admission: n/a  Microbiology results: 3/11 BCx: staph species, mec A detected  Thank you for allowing pharmacy to be a part of this patient's care.  Cindi CarbonMary M Sarit Sparano, PharmD Clinical Pharmacist 09/20/2015 12:37 PM

## 2015-09-20 NOTE — Clinical Social Work Note (Signed)
Clinical Social Work Assessment  Patient Details  Name: Destiny Gutierrez MRN: [id number] Date of Birth: 06/03/1925  Date of referral:  09/20/15               Reason for consult:  Facility Placement, Other (Comment Required) (From [facility] (SNF, LTC) )                Permission sought to share information with:  Oceanographeracility Contact Representative Permission granted to share information::  Yes, Verbal Permission Granted  Name::       [facility]              Agency::   Skilled Nursing Facility LTC  Relationship::     Contact Information:     Housing/Transportation Living arrangements for the past 2 months:  Skilled Building surveyorursing Facility Source of Information:  Power of [place], Adult Children Patient Interpreter Needed:  None Criminal Activity/Legal Involvement Pertinent to Current Situation/Hospitalization:  No - Comment as needed Significant Relationships:  Adult Children Lives with:  Facility Resident Do you feel safe going back to the place where you live?    Need for family participation in patient care:  Yes (Comment)  Care giving concerns:  Patient is a long term care resident at [facility].   Social Worker assessment / plan:  Visual merchandiserClinical Social Worker (CSW) received consult that patient is from a facility. CSW noted in ED notes that patient came from [facility]. CSW contacted [employer] [facility] liaison. Per Jomarie LongsJoseph patient is a long term care resident and can return to [facility] when stable for D/C. Per chart patient is not alert and oriented. CSW contacted patient's daughter Destiny Gutierrez. Per Destiny Gutierrez her sister Destiny Gutierrez is HPOA. Daughters Destiny Gutierrez and [place]. Per Destiny Gutierrez patient has been at [facility] for over 1 year now. Per daughters patient is total care, wheel chair bound and staff at [facility] utilize a hoyer lift. Per daughter patient is a DNR and they do not want feeding tube or intubation. Daughters are agreeable for patient to return to [facility] when medically stable.  FL2 complete and faxed out. CSW  will continue to follow and assist as needed.    Employment status:  Disabled (Comment on whether or not currently receiving Disability), Retired Health and safety inspectornsurance information:  Medicare, [place] PT Recommendations:  Not assessed at this time Information / Referral to community resources:  Skilled Nursing Facility  Patient/Family's Response to care:  Patient's daughters are agreeable for patient to return to [facility].   Patient/Family's Understanding of and Emotional Response to Diagnosis, Current Treatment, and Prognosis:  Daughters were pleasant and thanked CSW for visit.   Emotional Assessment Appearance:    Attitude/Demeanor/Rapport:  Unable to Assess Affect (typically observed):  Unable to Assess Orientation:  Oriented to Self, Fluctuating Orientation (Suspected and/or reported Sundowners) Alcohol / Substance use:  Not Applicable Psych involvement (Current and /or in the community):  No (Comment)  Discharge Needs  Concerns to be addressed:  Discharge Planning Concerns Readmission within the last 30 days:  No Current discharge risk:  Chronically ill, Dependent with Mobility, Cognitively Impaired Barriers to Discharge:  Continued Medical Work up   Destiny Gutierrez, Destiny Lasser G, LCSW 09/20/2015, 10:01 AM

## 2015-09-20 NOTE — Progress Notes (Signed)
[facility] [place] [facility]   PATIENT NAME: Destiny Gutierrez    MR#:  [id number]  DATE OF BIRTH:  28-May-1925  SUBJECTIVE:  CHIEF COMPLAINT:   Chief Complaint  Patient presents with  . Shortness of Breath   - patient admitted with sepsis, possible aspiration pneumonia - on NRB with sats 100%, alert but not verbally responsive or following commands - has a congested cough  REVIEW OF SYSTEMS:  Review of Systems  Unable to perform ROS: dementia    DRUG ALLERGIES:   Allergies  Allergen Reactions  . Codeine   . Oxycodone   . Penicillins   . Sulfa Antibiotics     VITALS:  Blood pressure 124/79, pulse 111, temperature 98.5 F (36.9 C), temperature source Axillary, resp. rate 24, height 5\' 5"  (1.651 m), weight 67.2 kg (148 lb 2.4 oz), SpO2 98 %.  PHYSICAL EXAMINATION:  Physical Exam  GENERAL:  82 y.o.-year-old patient lying in the bed, critically ill appearing.  EYES: Pupils equal, round, reactive to light and accommodation. No scleral icterus. Extraocular muscles intact.  HEENT: Head atraumatic, normocephalic. Oropharynx and nasopharynx clear. Nonrebreather mask in place. NECK:  Supple, no jugular venous distention. No thyroid enlargement, no tenderness.  LUNGS: Coarse breath sounds bilaterally with scattered rhonchi. no wheezing, rales or crepitation. No use of accessory muscles of respiration.  CARDIOVASCULAR: S1, S2 normal. No rubs, or gallops. 3/6 systolic murmur is present ABDOMEN: Soft, nontender, nondistended. Bowel sounds present. No organomegaly or mass.  Foley catheter in place. EXTREMITIES: No pedal edema, cyanosis, or clubbing.  Right hand bruised and flexed at the wrist, left hand swollen. Right leg is internally rotated and shortened due to history of femoral fracture NEUROLOGIC: Remains encephalopathic. Eyes open but not following any commands at this time. PSYCHIATRIC: The patient is alert. Encephalopathic SKIN: No obvious  rash, lesion, or ulcer.    LABORATORY PANEL:   CBC  Recent Labs Lab 09/20/15 0024  WBC 17.0*  HGB 12.3  HCT 37.3  PLT 212   ------------------------------------------------------------------------------------------------------------------  Chemistries   Recent Labs Lab 09/19/15 1430 09/20/15 0024 09/20/15 0433  NA 156* 150*  --   K 3.1* 2.8*  --   CL 113* 116*  --   CO2 30 25  --   GLUCOSE 198* 236*  --   BUN 49* 43*  --   CREATININE 1.10* 0.81  --   CALCIUM 9.1 8.1*  --   MG  --   --  2.9*  AST 27  --   --   ALT 15  --   --   ALKPHOS 124  --   --   BILITOT 1.1  --   --    ------------------------------------------------------------------------------------------------------------------  Cardiac Enzymes  Recent Labs Lab 09/20/15 0024  TROPONINI 0.15*   ------------------------------------------------------------------------------------------------------------------  RADIOLOGY:  Ct Head Wo Contrast  09/19/2015  CLINICAL DATA:  Altered mental status EXAM: CT HEAD WITHOUT CONTRAST TECHNIQUE: Contiguous axial images were obtained from the base of the skull through the vertex without intravenous contrast. COMPARISON:  None. FINDINGS: There is atrophy and extensive chronic small vessel disease changes. No acute intracranial abnormality. Specifically, no hemorrhage, hydrocephalus, mass lesion, acute infarction, or significant intracranial injury. No acute calvarial abnormality. Mastoid air cells are clear. IMPRESSION: Extensive chronic small vessel disease. Diffuse cerebral atrophy. No acute intracranial abnormality. Electronically Signed   By: Charlett NoseKevin  Dover M.D.   On: 09/19/2015 17:18   Dg Chest Portable 1 View  09/19/2015  CLINICAL DATA:  Altered mental status. EXAM: PORTABLE CHEST 1 VIEW COMPARISON:  None FINDINGS: No pneumothorax. The heart, hila, and mediastinum are unremarkable given portable technique. No pulmonary nodules, masses, or infiltrates. IMPRESSION: No  active disease. Electronically Signed   By: Gerome Sam III M.D   On: 09/19/2015 15:49    EKG:   Orders placed or performed during the hospital encounter of 09/19/15  . ED EKG  . ED EKG  . EKG 12-Lead  . EKG 12-Lead    ASSESSMENT AND PLAN:   82 year old female with past medical history of dementia, osteoarthritis, history of right knee replacement, who presents to the hospital due to altered mental status and in acute respiratory failure.  #1 Metabolic encephalopathy secondary to the sepsis, hypernatremia -CT head with diffuse cortical atrophy- consistent with her dementia- no acute findings. - cont trt of sepsis with IV ABX and D5W for increased sodium - minimal improvement today - At baseline, patient is total care dependent, has dementia, but able to maintain conversation with family.  #2 sepsis-on admission, Likely from aspiration pneumonia - on 100% NRB- transition to nasal cannula today - cont vanc and zosyn. Blood and urine cultures are pending -IV fluids  #3 Tachycardia- afib with rvr- known h/o paroxysmal afib once in past - HR around 100- patient is NPO- prn IV pushes if needed - ECHO  #4 hypernatremia - due to dehydration and poor by mouth intake. -Improving sodium, cont D5W for now  #5 acute renal failure- ATN, due to dehydration and poor by mouth intake and also underlying sepsis. -Hydrate with IV fluids, follow BUN/creatinine.  #6 Hypokalemia- being replaced, monitor  #7 Right hand pain and bruise- f/u Xray  #8 DVT Prophylaxis- SQ Heparin  #9 Elevated troponin- demand ischemia, stable   Discussed in detail with daughter Claris Che over the phone  All the records are reviewed and case discussed with ED provider. Management plans discussed with the patient, family and they are in agreement.  CODE STATUS: DNR   All the records are reviewed and case discussed with Care Management/Social Workerr. Management plans discussed with the patient, family and  they are in agreement.   TOTAL CRITICAL CARE TIME SPENT IN TAKING CARE OF THIS PATIENT:  38 minutes.   POSSIBLE D/C IN 2-3 DAYS, DEPENDING ON CLINICAL CONDITION.   Enid Baas M.D on 09/20/2015 at 9:23 AM  Between 7am to 6pm - Pager - [phone]  After 6pm go to [email or web address] - [email or web address] [facility]  [facility]  Office  [phone]  CC: Primary care physician; Dorothey Baseman, MD

## 2015-09-20 NOTE — NC FL2 (Signed)
[place] MEDICAID FL2 LEVEL OF CARE SCREENING TOOL     IDENTIFICATION  Patient Name: Destiny Gutierrez Birthdate: 03/09/1925 Sex: female Admission Date (Current Location): 09/19/2015  [facility] [place]  [place]  ([id number]) Facility and Address:  [facility], [address], [place], [place] [address]      Provider Number: [id number]  Attending Physician Name and Address:  Enid Baasadhika Kalisetti, MD  Relative Name and Phone Number:       Current Level of Care: Hospital Recommended Level of Care: Skilled Nursing Facility Prior Approval Number:    Date Approved/Denied:   PASRR Number:  ( [id number] )  Discharge Plan: SNF    Current Diagnoses: Patient Active Problem List   Diagnosis Date Noted  . Sepsis (HCC) 09/19/2015    Orientation RESPIRATION BLADDER Height & Weight     Self  Other (Comment) (Non-rebreather mask) Incontinent Weight: 148 lb 2.4 oz (67.2 kg) Height:  5\' 5"  (165.1 cm)  BEHAVIORAL SYMPTOMS/MOOD NEUROLOGICAL BOWEL NUTRITION STATUS   (none )  (none ) Incontinent Diet (NPO)  AMBULATORY STATUS COMMUNICATION OF NEEDS Skin   Extensive Assist Verbally PU Stage and Appropriate Care (Pressure Ulcer Stage 1: Ischial Tuberosity (Left) )                       Personal Care Assistance Level of Assistance  Bathing, Feeding, Dressing Bathing Assistance: Limited assistance Feeding assistance: Independent Dressing Assistance: Limited assistance     Functional Limitations Info  Sight, Hearing, Speech Sight Info: Adequate Hearing Info: Adequate Speech Info: Adequate    SPECIAL CARE FACTORS FREQUENCY                       Contractures      Additional Factors Info  Code Status, Allergies Code Status Info:  (DNR ) Allergies Info:  (Codeine, Oxycodone, Penicillins, Sulfa Antibiotics)           Current Medications (09/20/2015):  This is the current hospital active medication list Current  Facility-Administered Medications  Medication Dose Route Frequency Provider Last Rate Last Dose  . acetaminophen (TYLENOL) tablet 650 mg  650 mg Oral Q6H PRN Houston SirenVivek J Sainani, MD       Or  . acetaminophen (TYLENOL) suppository 650 mg  650 mg Rectal Q6H PRN Houston SirenVivek J Sainani, MD   650 mg at 09/19/15 1836  . antiseptic oral rinse (CPC / CETYLPYRIDINIUM CHLORIDE 0.05%) solution 7 mL  7 mL Mouth Rinse q12n4p Houston SirenVivek J Sainani, MD      . ceFEPIme (MAXIPIME) 2 g in dextrose 5 % 50 mL IVPB  2 g Intravenous Q24H Cindi CarbonMary M Swayne, RPH   2 g at 09/19/15 2237  . chlorhexidine (PERIDEX) 0.12 % solution 15 mL  15 mL Mouth Rinse BID Houston SirenVivek J Sainani, MD   15 mL at 09/19/15 2239  . dextrose 5 % with KCl 20 mEq / L  infusion  20 mEq Intravenous Continuous Houston SirenVivek J Sainani, MD   Stopped at 09/20/15 0315  . heparin injection 5,000 Units  5,000 Units Subcutaneous 3 times per day Houston SirenVivek J Sainani, MD   5,000 Units at 09/20/15 0512  . ondansetron (ZOFRAN) tablet 4 mg  4 mg Oral Q6H PRN Houston SirenVivek J Sainani, MD       Or  . ondansetron (ZOFRAN) injection 4 mg  4 mg Intravenous Q6H PRN Houston SirenVivek J Sainani, MD      . vancomycin (VANCOCIN) IVPB 750 mg/150 ml  premix  750 mg Intravenous Q24H Cindi Carbon, RPH   750 mg at 09/19/15 2312     Discharge Medications: Please see discharge summary for a list of discharge medications.  Relevant Imaging Results:  Relevant Lab Results:   Additional Information  (SSN: [id number])  Haig Prophet, LCSW

## 2015-09-20 NOTE — Progress Notes (Signed)
*  PRELIMINARY RESULTS* Echocardiogram 2D Echocardiogram has been performed.  Destiny Gutierrez 09/20/2015, 3:57 PM

## 2015-09-20 NOTE — Plan of Care (Signed)
Problem: Physical Regulation: Goal: Ability to maintain clinical measurements within normal limits will improve Outcome: Progressing Pt transfered today from CCU. VSS. Continue O2 at 4L per Halma with O2 sats at 92%. Disoriented x4. Follow simple commands at times. Minimal speech. No signs of pain nor discomfort. Foley d/c at 1635.

## 2015-09-20 NOTE — Progress Notes (Signed)
Patient tx to 2A.

## 2015-09-20 NOTE — Progress Notes (Signed)
Called MD to inform about potassium being down to 2.8, troponin 0.15, central tele monitoring called again about run of VT. Dr. Sheryle Hailiamond to put in orders at this time.

## 2015-09-20 NOTE — Progress Notes (Signed)
MD notified patient had 6 runs of paroxysmal supraventricular tachycardia and oxygen saturations have been in the high 80s. Order for scheduled PRN IV metoprolol for rate control, order for non-rebreather, keep 02 above 90%.

## 2015-09-21 ENCOUNTER — Inpatient Hospital Stay: Payer: Medicare Other

## 2015-09-21 LAB — BASIC METABOLIC PANEL
Anion gap: 3 — ABNORMAL LOW (ref 5–15)
BUN: 37 mg/dL — AB (ref 6–20)
CALCIUM: 8.7 mg/dL — AB (ref 8.9–10.3)
CO2: 29 mmol/L (ref 22–32)
CREATININE: 0.63 mg/dL (ref 0.44–1.00)
Chloride: 116 mmol/L — ABNORMAL HIGH (ref 101–111)
GFR calc non Af Amer: >60 mL/min (ref >60)
Glucose, Bld: 138 mg/dL — ABNORMAL HIGH (ref 65–99)
Potassium: 3.7 mmol/L (ref 3.5–5.1)
SODIUM: 148 mmol/L — AB (ref 135–145)

## 2015-09-21 LAB — CBC
HCT: 36 % (ref 35.0–47.0)
Hemoglobin: 11.6 g/dL — ABNORMAL LOW (ref 12.0–16.0)
MCH: 27.5 pg (ref 26.0–34.0)
MCHC: 32.1 g/dL (ref 32.0–36.0)
MCV: 85.6 fL (ref 80.0–100.0)
Platelets: 189 10*3/uL (ref 150–440)
RBC: 4.2 MIL/uL (ref 3.80–5.20)
RDW: 18.7 % — AB (ref 11.5–14.5)
WBC: 21.5 10*3/uL — ABNORMAL HIGH (ref 3.6–11.0)

## 2015-09-21 LAB — ECHOCARDIOGRAM COMPLETE
HEIGHTINCHES: 65 in
Weight: 2370.39 oz

## 2015-09-21 LAB — URINE CULTURE: Culture: NO GROWTH

## 2015-09-21 MED ORDER — ENOXAPARIN SODIUM 40 MG/0.4ML ~~LOC~~ SOLN
40.0000 mg | SUBCUTANEOUS | Status: DC
  Administered 2015-09-21 – 2015-09-23 (×3): 40 mg via SUBCUTANEOUS
  Filled 2015-09-21 (×4): qty 0.4

## 2015-09-21 MED ORDER — SODIUM CHLORIDE 0.9 % IV SOLN
1.0000 g | Freq: Two times a day (BID) | INTRAVENOUS | Status: DC
  Administered 2015-09-21 – 2015-09-23 (×5): 1 g via INTRAVENOUS
  Filled 2015-09-21 (×7): qty 1

## 2015-09-21 NOTE — Progress Notes (Signed)
Pharmacy Antibiotic Note  Destiny Gutierrez is a [address] y.o. female admitted on 09/19/2015 with sepsis.  Pharmacy has been consulted for Meropenem dosing, patient with possible aspiration pneumonia as well, MD would like to provide anaerobic coverage.  Plan: Will order Meropenem 1g IV q12h, dose reduced for renal function.  Height: 5\' 5"  (165.1 cm) Weight: 152 lb 8 oz (69.174 kg) IBW/kg (Calculated) : 57  Temp (24hrs), Avg:98.4 F (36.9 C), Min:97.9 F (36.6 C), Max:98.8 F (37.1 C)   Recent Labs Lab 09/19/15 1430 09/19/15 1726 09/20/15 0024 09/20/15 1045 09/21/15 0437  WBC 18.4*  --  17.0*  --  21.5*  CREATININE 1.10*  --  0.81 0.62 0.63  LATICACIDVEN 2.7* 2.5*  --   --   --     Estimated Creatinine Clearance: 46.6 mL/min (by C-G formula based on Cr of 0.63).    Allergies  Allergen Reactions  . Codeine   . Oxycodone   . Penicillins   . Sulfa Antibiotics     Antimicrobials this admission: Vancomycin 3/11 >> 3/13 Cefepime 3/11 >> 3/13 Meropenem 3/13 >>  Dose adjustments this admission:  Microbiology results:  Thank you for allowing pharmacy to be a part of this patient's care.  Clovia CuffLisa Wister Hoefle, PharmD, BCPS 09/21/2015 1:36 PM

## 2015-09-21 NOTE — Care Management Important Message (Signed)
Important Message  Patient Details  Name: Destiny Gutierrez MRN: [id number] Date of Birth: [birth year]   Medicare Important Message Given:  Yes    Olegario MessierKathy A Jashawn Floyd 09/21/2015, 1:16 PM

## 2015-09-21 NOTE — Care Management (Signed)
[facility]  She is being treated for sepsis and was transferred from 1C to 2A 3/12 due to  heart rate and increased 02 requirements.  Concern for aspiration pneumonia.  She has dementia and currently a DNR.  She does not have record of frequent admissions or presentations to [facility] over the last six months . Patient may benefit from palliative care consult for goals of treatment

## 2015-09-21 NOTE — Progress Notes (Signed)
Pt transferred to room 251. Pt disoriented, VSS, no complaints at this time. Tele box verified with Marchelle FolksAmanda, NT, skin assessed with Serina CowperAlisa, RN. RN will continue to monitor and treat per MD orders. Syliva Overmanassie A Niquan Charnley, RN

## 2015-09-21 NOTE — Progress Notes (Signed)
Initial Nutrition Assessment   INTERVENTION:   Meals and Snacks: Cater to patient preferences; SLP evaluating. Per paper chart pt baseline diet order dysphagia I, nectar thick liquids Medical Food Supplement Therapy: will recommend Mighty Shakes on meal trays TID (each supplement provides approximately 300kcals and 9g protein), RD notes SLP ordered Magic Cup on lunch trays.   NUTRITION DIAGNOSIS:   Swallowing difficulty related to dysphagia as evidenced by  (baseline diet order; SLP following).  GOAL:   Patient will meet greater than or equal to 90% of their needs  MONITOR:   PO intake, Supplement acceptance, Diet advancement, Labs, Weight trends, I & O's  REASON FOR ASSESSMENT:   Malnutrition Screening Tool    ASSESSMENT:   Pt admitted from [facility] with AMS and difficulty breathing. Per MD note pt with sepsis, questionable aspiration pna with h/o dementia and recent right hip fracture.  Past Medical History  Diagnosis Date  . Dementia   . Vitamin D deficiency   . MDD (major depressive disorder) (HCC)   . UTI (lower urinary tract infection)   . Osteoarthritis   . Knee joint replacement status      Diet Order:  DIET - DYS 1 Room service appropriate?: No; Fluid consistency:: Nectar Thick    Current Nutrition: Pt NPO this am. Pt resting on visit.  Food/Nutrition-Related History: Pt's daughter report that pt was eating very well until this past Saturday, started not feeling well and not eating. Pt's daughter reports pt was eating pureed foods with nectar thick liquids at baseline.  Without prompting, pt's family report wanting to continue thickened liquids if able, pending SLP evaluation, as they report not wanting feeding tubes.    Scheduled Medications:  . antiseptic oral rinse  7 mL Mouth Rinse q12n4p  . ceFEPime (MAXIPIME) IV  2 g Intravenous Q24H  . chlorhexidine  15 mL Mouth Rinse BID  . enoxaparin (LOVENOX) injection  40 mg Subcutaneous Q24H  .  metoprolol  5 mg Intravenous 4 times per day  . tobramycin  1 drop Both Eyes 6 times per day  . tobramycin-dexamethasone   Both Eyes BID    Electrolyte/Renal Profile and Glucose Profile:   Recent Labs Lab 09/20/15 0024 09/20/15 0433 09/20/15 1045 09/21/15 0437  NA 150*  --  149* 148*  K 2.8*  --  3.5 3.7  CL 116*  --  115* 116*  CO2 25  --  27 29  BUN 43*  --  41* 37*  CREATININE 0.81  --  0.62 0.63  CALCIUM 8.1*  --  8.7* 8.7*  MG  --  2.9*  --   --   GLUCOSE 236*  --  173* 138*   Protein Profile:  Recent Labs Lab 09/19/15 1430  ALBUMIN 2.7*    Gastrointestinal Profile: Last BM: 09/21/2015 loose watery stool   Nutrition-Focused Physical Exam Findings:  Unable to complete Nutrition-Focused physical exam at this time.    Weight Change: Pt's family report pt had lost 13.5lbs when last weighed on 08/17/2015 and they believe pt has lost more weight. Per chart current weight of 152lbs (5% weight loss in one month)   Skin:   (Stage I Ischial Tuberosity pressure ulcer per RN documentation)   Height:   Ht Readings from Last 1 Encounters:  09/19/15  (1.651 m)    Weight:   Wt Readings from Last 1 Encounters:  09/21/15 152 lb 8 oz (69.174 kg)     BMI:  Body mass index is  25.38 kg/(m^2).  Estimated Nutritional Needs:   Kcal:  BEE: 1115kcals, TEE: (IF 1.1-1.3)(AF 1.2) 1473-1740kcals  Protein:  69-83g protein (1.0-1.2g/kg)  Fluid:  1725-206470mL of fluid (25-2130mL/kg)  EDUCATION NEEDS:   No education needs identified at this time   HIGH Care Level  Leda QuailAllyson Sandra Tellefsen, RD, LDN Pager [phone] Weekend/On-Call Pager [phone]

## 2015-09-21 NOTE — Evaluation (Signed)
Clinical/Bedside Swallow Evaluation Patient Details  Name: Destiny Gutierrez MRN: [id number] Date of Birth: [birth year]  Today's Date: 09/21/2015 Time: SLP Start Time (ACUTE ONLY): 1050 SLP Stop Time (ACUTE ONLY): 1150 SLP Time Calculation (min) (ACUTE ONLY): 60 min  Past Medical History:  Past Medical History  Diagnosis Date  . Dementia   . Vitamin D deficiency   . MDD (major depressive disorder) (HCC)   . UTI (lower urinary tract infection)   . Osteoarthritis   . Knee joint replacement status    Past Surgical History: No past surgical history on file. HPI:  Pt is a 82 y.o. female with a known history of Dementia with bedbound status, history of depression, history of previous UTIs, osteoarthritis, status post right knee replacement, recent fall with a right hip fracture treated nonoperatively who presents to the hospital due to altered mental status and a cough. Patient herself has baseline dementia and is currently altered and therefore most of the history obtained from the daughters at bedside and also from the chart. Patient was in her usual state of health until this morning she was noted to be more lethargic and altered and also noted to have a cough with upper airway rattling sounds are noted to be hypoxic. She was sent to the ER for further evaluation noted to be febrile, tachycardic, and noted to have an elevated lactic acid consistent with sepsis. Given her upper airway respiratory symptoms she was thought to have a pneumonia. Currently, pt is on increased O2 support(nonrebreather mask) and appears quite fatgiued w/ having to maintain an alert status; she often closed her eyes b/t trials. She does not feed self at baseline. Dtrs stated pt is usually a good eater and will drink the thickened liquids "much of the time".    Assessment / Plan / Recommendation Clinical Impression  Pt appeared to adequately tolerate trials of Nectar liquids via TSP and purees via 1/2 tsp amounts w/ no  immediate, overt s/s of aspiration and no decline in respiratory status. Pt was give rest breaks b/t trials in order to reduce effort of WOB; nonrebreather mask maintained b/t feeding of trials. Pt exhibited clear vocal quality when assessed x3 b/t trials. Pt appeared to fatigue after only a few tsp trials of each consistency. Pt required feeding (baseline). Pt appears at increased risk for aspiration at this time; pt does have a baseline dx of dysphagia and is on a [facility]ph[facility]ia diet at [facility]. Rec. a Dys. 1 w/ Nectar liquids diet w/ strict aspiration precautions and feeding assistance; aspiration precautions; Meds in puree - crushed as able. Feeding at all meals. NSG staff to monitor pt's respiratory status for po intake and hold any po's if too sleepy to safely eat/drink.     Aspiration Risk  Moderate aspiration risk    Diet Recommendation  Dys. 1 w/ Nectar consistency liquids; strict Aspiration precautions; feeding assistance - only feed when fully awake and alert.   Medication Administration: Crushed with puree    Other  Recommendations Recommended Consults:  (Dietician) Oral Care Recommendations: Oral care BID;Staff/trained caregiver to provide oral care Other Recommendations: Order thickener from pharmacy;Prohibited food (jello, ice cream, thin soups);Remove water pitcher   Follow up Recommendations  Skilled Nursing facility    Frequency and Duration min 3x week  2 weeks       Prognosis Prognosis for Safe Diet Advancement: Fair Barriers to Reach Goals: Cognitive deficits;Severity of deficits      Swallow Study   General Date of  Onset: 09/19/15 HPI: Pt is a 82 y.o. female with a known history of Dementia with bedbound status, history of depression, history of previous UTIs, osteoarthritis, status post right knee replacement, recent fall with a right hip fracture treated nonoperatively who presents to the hospital due to altered mental status and a cough. Patient herself has baseline  dementia and is currently altered and therefore most of the history obtained from the daughters at bedside and also from the chart. Patient was in her usual state of health until this morning she was noted to be more lethargic and altered and also noted to have a cough with upper airway rattling sounds are noted to be hypoxic. She was sent to the ER for further evaluation noted to be febrile, tachycardic, and noted to have an elevated lactic acid consistent with sepsis. Given her upper airway respiratory symptoms she was thought to have a pneumonia. Currently, pt is on increased O2 support(nonrebreather mask) and appears quite fatgiued w/ having to maintain an alert status; she often closed her eyes b/t trials. She does not feed self at baseline. Dtrs stated pt is usually a good eater and will drink the thickened liquids "much of the time".  Type of Study: Bedside Swallow Evaluation Previous Swallow Assessment: possibly ~1 yr ago Diet Prior to this Study: Dysphagia 1 (puree);Nectar-thick liquids (at NH for ~10 months) Temperature Spikes Noted: No (wbc 21.5) Respiratory Status: Non-rebreather (4-6 liters) History of Recent Intubation: No Behavior/Cognition: Lethargic/Drowsy;Confused;Requires cueing Oral Cavity Assessment: Dry Oral Care Completed by SLP: Recent completion by staff Oral Cavity - Dentition: Missing dentition Vision:  (n/a) Self-Feeding Abilities: Total assist Patient Positioning: Upright in bed Baseline Vocal Quality: Low vocal intensity (mumbled responses) Volitional Cough: Weak;Congested Volitional Swallow: Unable to elicit    Oral/Motor/Sensory Function Overall Oral Motor/Sensory Function: Generalized oral weakness (difficult to assess sec. to weakness and Cognition) Facial Symmetry: Within Functional Limits Lingual ROM: Within Functional Limits Lingual Symmetry: Within Functional Limits Mandible: Within Functional Limits   Ice Chips Ice chips: Within functional  limits Presentation: Spoon (2 trials; fed) Other Comments: adequate bolus management of trials   Thin Liquid Thin Liquid: Not tested Other Comments: not her baseline    Nectar Thick Nectar Thick Liquid: Within functional limits Presentation: Spoon (6 trials; fed) Other Comments: too drowsy toward end of trials   Honey Thick Honey Thick Liquid: Not tested   Puree Puree: Within functional limits Presentation: Spoon (4 trials; fed) Other Comments: too drowsy toward end of trials   Solid   GO   Solid: Not tested Other Comments: not her baseline         [name], MS, CCC-SLP  Aidynn Krenn 09/21/2015,2:25 PM

## 2015-09-21 NOTE — Progress Notes (Addendum)
[facility] Physicians - [place] at [facility]   PATIENT NAME: Destiny Gutierrez    MR#:  [id number]  DATE OF BIRTH:  December 21, 1924  SUBJECTIVE:  CHIEF COMPLAINT:   Chief Complaint  Patient presents with  . Shortness of Breath   - patient admitted with sepsis, possible aspiration pneumonia - on NRB with sats 100%, alert but not verbally responsive or following commands - has a congested cough  REVIEW OF SYSTEMS:  Review of Systems  Unable to perform ROS: dementia    DRUG ALLERGIES:   Allergies  Allergen Reactions  . Codeine   . Oxycodone   . Penicillins   . Sulfa Antibiotics     VITALS:  Blood pressure 140/74, pulse 74, temperature 98.7 F (37.1 C), temperature source Oral, resp. rate 20, height  (1.651 m), weight 69.174 kg (152 lb 8 oz), SpO2 98 %.  PHYSICAL EXAMINATION:  Physical Exam  GENERAL:  82 y.o.-year-old patient lying in the bed, critically ill appearing.  EYES: Pupils equal, round, reactive to light and accommodation. No scleral icterus. Extraocular muscles intact.  HEENT: Head atraumatic, normocephalic. Oropharynx and nasopharynx clear. Nonrebreather mask in place. NECK:  Supple, no jugular venous distention. No thyroid enlargement, no tenderness.  LUNGS: Coarse breath sounds bilaterally with scattered rhonchi. no wheezing, rales or crepitation. No use of accessory muscles of respiration.  CARDIOVASCULAR: S1, S2 normal. No rubs, or gallops. 3/6 systolic murmur is present ABDOMEN: Soft, nontender, nondistended. Bowel sounds present. No organomegaly or mass.  Foley catheter in place. EXTREMITIES: No pedal edema, cyanosis, or clubbing.  Right hand bruised and flexed at the wrist, left hand swollen. Right leg is internally rotated and shortened due to history of femoral fracture NEUROLOGIC: Remains encephalopathic. Eyes open but not following any commands at this time. PSYCHIATRIC: The patient is alert. Encephalopathic SKIN: No obvious rash,  lesion, or ulcer.    LABORATORY PANEL:   CBC  Recent Labs Lab 09/21/15 0437  WBC 21.5*  HGB 11.6*  HCT 36.0  PLT 189   ------------------------------------------------------------------------------------------------------------------  Chemistries   Recent Labs Lab 09/19/15 1430  09/20/15 0433  09/21/15 0437  NA 156*  < >  --   < > 148*  K 3.1*  < >  --   < > 3.7  CL 113*  < >  --   < > 116*  CO2 30  < >  --   < > 29  GLUCOSE 198*  < >  --   < > 138*  BUN 49*  < >  --   < > 37*  CREATININE 1.10*  < >  --   < > 0.63  CALCIUM 9.1  < >  --   < > 8.7*  MG  --   --  2.9*  --   --   AST 27  --   --   --   --   ALT 15  --   --   --   --   ALKPHOS 124  --   --   --   --   BILITOT 1.1  --   --   --   --   < > = values in this interval not displayed. ------------------------------------------------------------------------------------------------------------------  Cardiac Enzymes  Recent Labs Lab 09/20/15 0024  TROPONINI 0.15*   ------------------------------------------------------------------------------------------------------------------  RADIOLOGY:  Dg Chest 1 View  09/20/2015  CLINICAL DATA:  Shortness of breath, sepsis, possible aspiration pneumonia EXAM: CHEST 1 VIEW COMPARISON:  09/19/2015 FINDINGS: Mild  patchy right infrahilar opacity, increased, suspicious for pneumonia. No pleural effusion or pneumothorax. The heart is top-normal in size. Mild prominence of the right paratracheal stripe is favored to be vascular. IMPRESSION: Mild patchy right infrahilar opacity, increased, suspicious for pneumonia. Electronically Signed   By: Charline BillsSriyesh  Krishnan M.D.   On: 09/20/2015 10:40   Ct Head Wo Contrast  09/19/2015  CLINICAL DATA:  Altered mental status EXAM: CT HEAD WITHOUT CONTRAST TECHNIQUE: Contiguous axial images were obtained from the base of the skull through the vertex without intravenous contrast. COMPARISON:  None. FINDINGS: There is atrophy and extensive chronic  small vessel disease changes. No acute intracranial abnormality. Specifically, no hemorrhage, hydrocephalus, mass lesion, acute infarction, or significant intracranial injury. No acute calvarial abnormality. Mastoid air cells are clear. IMPRESSION: Extensive chronic small vessel disease. Diffuse cerebral atrophy. No acute intracranial abnormality. Electronically Signed   By: Charlett NoseKevin  Dover M.D.   On: 09/19/2015 17:18   Dg Chest Portable 1 View  09/19/2015  CLINICAL DATA:  Altered mental status. EXAM: PORTABLE CHEST 1 VIEW COMPARISON:  None FINDINGS: No pneumothorax. The heart, hila, and mediastinum are unremarkable given portable technique. No pulmonary nodules, masses, or infiltrates. IMPRESSION: No active disease. Electronically Signed   By: Gerome Samavid  Williams III M.D   On: 09/19/2015 15:49   Dg Hand Complete Right  09/20/2015  CLINICAL DATA:  Sepsis.  Swelling of the right hand. EXAM: RIGHT HAND - COMPLETE 3+ VIEW COMPARISON:  None. FINDINGS: An IV is present at the base of the first metacarpal. Oxygen saturation device is present at the distal ring finger. Moderate osteopenia is present. Soft tissue swelling is present over the dorsum of the hand. There is no underlying fracture or dislocation. No osseous destruction is present. Degenerative changes are present in the DIP joints and at the first [facility]. IMPRESSION: 1. Soft tissue swelling over the dorsum the hand at the MCP joints. 2. No acute or focal osseous abnormality subjacent. 3. Moderate osteoarthritic changes. Electronically Signed   By: Marin Robertshristopher  Mattern M.D.   On: 09/20/2015 10:38    EKG:   Orders placed or performed during the hospital encounter of 09/19/15  . ED EKG  . ED EKG  . EKG 12-Lead  . EKG 12-Lead    ASSESSMENT AND PLAN:   82 year old female with past medical history of dementia, osteoarthritis, history of right knee replacement, who presents to the hospital due to altered mental status and in acute respiratory  failure.  #1 Metabolic encephalopathy secondary to the sepsis, hypernatremia -CT head with diffuse cortical atrophy- consistent with her dementia- no acute findings. - cont trt of sepsis with IV ABX and D5W for increased sodium - Slowly improving. Patient is more alert and following simple commands today. - At baseline she is total care dependent, has dementia, but able to maintain conversation with family.  #2 sepsis-on admission, likely from aspiration pneumonia - on 100% NRB- unable to be weaned off - Acute hypoxic respiratory failure- aspiration pneumonia Repeat CXR today  - cont vanc and cefepime. Blood and urine cultures are negative so far. One set of blood cultures growing coagulate negative staph. Likely contaminant. We'll discontinue vancomycin Change cefepime to meropenem for anerobics  #3 acute hypoxic respiratory failure-likely from aspiration pneumonia. -Remains on 100% nonrebreather. Follow-up chest x-ray today. - Speech therapy consult requested. Change to meropenem to cover anerobics -Baseline she eats pured diet with nectar thick liquids.  #3 Tachycardia- afib with rvr- known h/o paroxysmal afib once in past -  HR around 100- patient is NPO- prn IV pushes if needed Start oral metoprolol once able to eat PO - ECHO normal EF 55%  #4 hypernatremia - due to dehydration and poor by mouth intake. -Improving sodium,  On D5W.  #5 Acute renal failure- ATN, due to dehydration and poor by mouth intake and also underlying sepsis. - Follow BUN/creatinine.  Improved.  #6 Hypokalemia- replaced, monitor  #7 Right hand pain and bruise- subcutaneous edema from trauma may be. But no fracture.  #8 DVT Prophylaxis- SQ Heparin  #9 Elevated troponin- demand ischemia, stable  Discussed in detail with daughters at bedside.  All the records are reviewed and case discussed with ED provider. Management plans discussed with the patient, family and they are in agreement.  CODE  STATUS: DNR   All the records are reviewed and case discussed with Care Management/Social Workerr. Management plans discussed with the patient, family and they are in agreement.   TOTAL TIME SPENT IN TAKING CARE OF THIS PATIENT:  38 minutes.   POSSIBLE D/C IN 2-3 DAYS, DEPENDING ON CLINICAL CONDITION.   Enid Baas M.D on 09/21/2015 at 12:13 PM  Between 7am to 6pm - Pager - [phone]  After 6pm go to [email or web address] - password [email or web address] [facility]  [place] [facility]  Office  [phone]  CC: Primary care physician; Dorothey Baseman, MD

## 2015-09-22 LAB — CBC
HEMATOCRIT: 34.6 % — AB (ref 35.0–47.0)
HEMOGLOBIN: 11.2 g/dL — AB (ref 12.0–16.0)
MCH: 27.3 pg (ref 26.0–34.0)
MCHC: 32.2 g/dL (ref 32.0–36.0)
MCV: 84.8 fL (ref 80.0–100.0)
Platelets: 198 10*3/uL (ref 150–440)
RBC: 4.09 MIL/uL (ref 3.80–5.20)
RDW: 18.4 % — ABNORMAL HIGH (ref 11.5–14.5)
WBC: 19.2 10*3/uL — AB (ref 3.6–11.0)

## 2015-09-22 LAB — BASIC METABOLIC PANEL
ANION GAP: 2 — AB (ref 5–15)
BUN: 29 mg/dL — AB (ref 6–20)
CHLORIDE: 114 mmol/L — AB (ref 101–111)
CO2: 28 mmol/L (ref 22–32)
Calcium: 8.3 mg/dL — ABNORMAL LOW (ref 8.9–10.3)
Creatinine, Ser: 0.51 mg/dL (ref 0.44–1.00)
GFR calc Af Amer: >60 mL/min (ref >60)
GFR calc non Af Amer: >60 mL/min (ref >60)
GLUCOSE: 115 mg/dL — AB (ref 65–99)
POTASSIUM: 3.6 mmol/L (ref 3.5–5.1)
Sodium: 144 mmol/L (ref 135–145)

## 2015-09-22 MED ORDER — DEXTROSE-NACL 5-0.45 % IV SOLN
INTRAVENOUS | Status: AC
  Administered 2015-09-22 – 2015-09-23 (×2): via INTRAVENOUS

## 2015-09-22 MED ORDER — FLUOXETINE HCL 10 MG PO CAPS
10.0000 mg | ORAL_CAPSULE | Freq: Every day | ORAL | Status: DC
  Administered 2015-09-22 – 2015-09-23 (×2): 10 mg via ORAL
  Filled 2015-09-22 (×2): qty 1

## 2015-09-22 MED ORDER — FLUCONAZOLE IN SODIUM CHLORIDE 100-0.9 MG/50ML-% IV SOLN
100.0000 mg | INTRAVENOUS | Status: DC
  Administered 2015-09-22 – 2015-09-23 (×2): 100 mg via INTRAVENOUS
  Filled 2015-09-22 (×3): qty 50

## 2015-09-22 MED ORDER — METOPROLOL TARTRATE 25 MG PO TABS
25.0000 mg | ORAL_TABLET | Freq: Three times a day (TID) | ORAL | Status: DC
  Administered 2015-09-22 – 2015-09-23 (×5): 25 mg via ORAL
  Filled 2015-09-22 (×6): qty 1

## 2015-09-22 NOTE — Care Management (Signed)
Patient is requiring increase on 02 demands and now is on high flow nasal cannula. Discussed the benefits of a palliative care consult (even though it is not available today) to address goals.  Patient is a DNR but respiratory status is on a decline today.

## 2015-09-22 NOTE — Progress Notes (Signed)
CSW was informed by RN in progression that patient will be switched to High Flow Nasal Cannula. Patient cannot discharge to [facility] on High Flow Nasal Cannula. Patient must be on Nasal Cannula 4 (L/min) or less at discharge. CSW will continue to follow and assist.  Woodroe Modehristina Constantina Laseter, MSW, LCSW-A Clinical Social Work Department [phone]

## 2015-09-22 NOTE — Progress Notes (Signed)
[facility] Physicians - [place] at [facility]   PATIENT NAME: Destiny Gutierrez    MR#:  [id number]  DATE OF BIRTH:  1924-08-19  SUBJECTIVE:  CHIEF COMPLAINT:   Chief Complaint  Patient presents with  . Shortness of Breath   - Patient remains on nonrebreather mask. Answering questions and alert but continues to be extremely weak. -Still has shallow rapid breathing at times.  REVIEW OF SYSTEMS:  Review of Systems  Unable to perform ROS: dementia    DRUG ALLERGIES:   Allergies  Allergen Reactions  . Codeine   . Oxycodone   . Penicillins   . Sulfa Antibiotics     VITALS:  Blood pressure 161/80, pulse 72, temperature 98.4 F (36.9 C), temperature source Oral, resp. rate 22, height  (1.651 m), weight 69.174 kg (152 lb 8 oz), SpO2 100 %.  PHYSICAL EXAMINATION:  Physical Exam  GENERAL:  82 y.o.-year-old patient lying in the bed, ill appearing.  EYES: Pupils equal, round, reactive to light and accommodation. No scleral icterus. Extraocular muscles intact.  HEENT: Head atraumatic, normocephalic. Oropharynx and nasopharynx clear. Nonrebreather mask in place. NECK:  Supple, no jugular venous distention. No thyroid enlargement, no tenderness.  LUNGS: Coarse breath sounds bilaterally with scattered rhonchi. no wheezing, rales or crepitation. No use of accessory muscles of respiration.  CARDIOVASCULAR: S1, S2 normal. No rubs, or gallops. 3/6 systolic murmur is present ABDOMEN: Soft, nontender, nondistended. Bowel sounds present. No organomegaly or mass.  Foley catheter in place. EXTREMITIES: No pedal edema, cyanosis, or clubbing.  Right hand bruised and flexed at the wrist- improved swelling of the right hand.. Right leg is internally rotated and shortened due to history of femoral fracture NEUROLOGIC: Alert and following simple commands. Unable to move the right leg much due to her previous fracture and internal rotation. PSYCHIATRIC: The patient is alert. Seems  to be oriented to self at this time. SKIN: No obvious rash, lesion, or ulcer.    LABORATORY PANEL:   CBC  Recent Labs Lab 09/22/15 0434  WBC 19.2*  HGB 11.2*  HCT 34.6*  PLT 198   ------------------------------------------------------------------------------------------------------------------  Chemistries   Recent Labs Lab 09/19/15 1430  09/20/15 0433  09/22/15 0434  NA 156*  < >  --   < > 144  K 3.1*  < >  --   < > 3.6  CL 113*  < >  --   < > 114*  CO2 30  < >  --   < > 28  GLUCOSE 198*  < >  --   < > 115*  BUN 49*  < >  --   < > 29*  CREATININE 1.10*  < >  --   < > 0.51  CALCIUM 9.1  < >  --   < > 8.3*  MG  --   --  2.9*  --   --   AST 27  --   --   --   --   ALT 15  --   --   --   --   ALKPHOS 124  --   --   --   --   BILITOT 1.1  --   --   --   --   < > = values in this interval not displayed. ------------------------------------------------------------------------------------------------------------------  Cardiac Enzymes  Recent Labs Lab 09/20/15 0024  TROPONINI 0.15*   ------------------------------------------------------------------------------------------------------------------  RADIOLOGY:  Dg Chest Port 1 View  09/21/2015  CLINICAL DATA:  Acute  hypoxic respiratory failure. Aspiration pneumonia. EXAM: PORTABLE CHEST 1 VIEW COMPARISON:  September 21, 2015. FINDINGS: Stable cardiomediastinal silhouette. No pneumothorax is noted. No significant pleural effusion is noted. Atherosclerosis of descending thoracic aorta is noted. Stable right infrahilar opacity is noted concerning for possible pneumonia. Mildly increased left basilar subsegmental atelectasis is noted. Bony thorax is unremarkable. IMPRESSION: Stable right infrahilar opacity is noted concerning for pneumonia. Mildly increased left basilar subsegmental atelectasis is noted. Electronically Signed   By: Lupita RaiderJames  Green Jr, M.D.   On: 09/21/2015 13:51    EKG:   Orders placed or performed during the  hospital encounter of 09/19/15  . ED EKG  . ED EKG  . EKG 12-Lead  . EKG 12-Lead    ASSESSMENT AND PLAN:   82 year old female with past medical history of dementia, osteoarthritis, history of right knee replacement, who presents to the hospital due to altered mental status and in acute respiratory failure.  #1 Metabolic encephalopathy secondary to the sepsis, hypernatremia -Hypernatremia has been resolved -CT head with diffuse cortical atrophy- consistent with her dementia- no acute findings. - cont trt of sepsis with IV ABX and D5W for increased sodium - Slowly improving. Patient is more alert and following simple commands today. - At baseline she is total care dependent, has dementia, but able to maintain conversation with family.  #2 sepsis-on admission, likely from aspiration pneumonia - on 100% NRB- unable to be weaned off -Blood and urine cultures are negative so far. One set of blood cultures growing coagulate negative staph. Likely contaminant. We'll discontinue vancomycin On meropenem. Change to high flow nasal cannula today from nonrebreather.  #3 acute hypoxic respiratory failure-likely from aspiration pneumonia. -Remains on 100% nonrebreather. Chest x-ray showing right infrahilar infiltrate, likely consistent with aspiration pneumonia -Changed to high flow nasal cannula today -Minimal improvement in white count. Antibiotics changed from cefepime to meropenem to cover anaerobics. - Speech therapy consult requested.  -Baseline she eats pured diet with nectar thick liquids.  #3 Tachycardia- afib with rvr- known h/o paroxysmal afib once in past - HR improved on IV metoprolol. Since able to take oral meds, change metoprolol to by mouth today. - ECHO normal EF 55%  #4 hypernatremia - due to dehydration and poor by mouth intake. -Improving sodium,  On D5W.  #5 Acute renal failure- ATN, due to dehydration and poor by mouth intake and also underlying sepsis. - Resolved    #6 Hypokalemia- replaced, monitor  #7 Right hand pain and bruise- subcutaneous edema from trauma may be. But no fracture. Continue ice pack and keep the arm elevated.  #8 DVT Prophylaxis- SQ Heparin  #9 Elevated troponin- demand ischemia, stable   All the records are reviewed and case discussed with ED provider. Management plans discussed with the patient, family and they are in agreement.  CODE STATUS: DNR   All the records are reviewed and case discussed with Care Management/Social Workerr. Management plans discussed with the patient, family and they are in agreement.   TOTAL TIME SPENT IN TAKING CARE OF THIS PATIENT:  37 minutes.   POSSIBLE D/C IN 2-3 DAYS, DEPENDING ON CLINICAL CONDITION.   Enid BaasKALISETTI,Deveney Bayon M.D on 09/22/2015 at 10:25 AM  Between 7am to 6pm - Pager - [phone]  After 6pm go to [email or web address] - password [email or web address]  [place] [facility] [place] [facility]  Office  [phone]  CC: Primary care physician; Dorothey BasemanAVID BRONSTEIN, MD

## 2015-09-22 NOTE — Progress Notes (Signed)
Speech Language Pathology Treatment: Dysphagia  Patient Details Name: Destiny Gutierrez MRN: [id number] DOB: 1924/10/19 Today's Date: 09/22/2015 Time: 1100-1200 SLP Time Calculation (min) (ACUTE ONLY): 60 min  Assessment / Plan / Recommendation Clinical Impression  Pt appears to present w/ increased weakness though off the non-rebreather O2 support and now on the HFNC O2 support. Pt has been more awake per report taking bites and sips w/ Dtrs but currently appears lethargic and drowsy. She appeared to tolerate 3-4 (1/2) tsp trials of Nectar consistency juice but did exhibit a delayed, mild cough - suspect could be related to buildup of pharyngeal residue, delayed pharyngeal swallow(?). Pt did not appear to have any immediate decline in respiratory status w/ the po trials but does appear fatigued w/ any exertion including the exertion of taking po trials. Dtrs were concerned about potential Thrush - noted a mild, white coating on mid-tongue. Educated Dtrs on the strict aspiration precautions as pt is following; diet consistency and prep of foods(Dtrs stated the purees are too thick at times); and the use of the supplements fed to her via tsp and alternating w/ the thickened liquids to aid oral clearing. Discussed downgrading the liquid consistency to Honey to be more conservative during this time sec. to pt's severe weakness and decreased alertness; Dtrs agreed. Discussed the above w/ MD - the changes in diet; concerns of Dysphagia and for aspiration. Rec'd a Palliative Care consult. MD will f/u w/ Dtr's concern for Thrush and any tx(IV nec. d/t oropharyngeal phase dysphagia).       HPI HPI: Pt is a 82 y.o. female with a known history of Dementia with bedbound status, history of depression, history of previous UTIs, osteoarthritis, status post right knee replacement, recent fall with a right hip fracture treated nonoperatively who presents to the hospital due to altered mental status and a cough. Patient  herself has baseline dementia and is currently altered and therefore most of the history obtained from the daughters at bedside and also from the chart. Patient was in her usual state of health until this morning she was noted to be more lethargic and altered and also noted to have a cough with upper airway rattling sounds are noted to be hypoxic. She was sent to the ER for further evaluation noted to be febrile, tachycardic, and noted to have an elevated lactic acid consistent with sepsis. Given her upper airway respiratory symptoms she was thought to have a pneumonia. Currently, pt is on increased O2 support(HFNC) and appears quite fatigued w/ having to maintain an alert state and sitting upright; she often closed her eyes b/t trials fed to her. She does not feed self at baseline. Dtrs present stated that pt did not tolerate some of the thicker purees in her diet; the Mighty shakes were too "thin" for her toleration of Nectar consistency. Dtrs stated pt had been tolerating 1/2 tsp amounts of the Nectar drinks they fed to her; she exhibited increased oral residue last PM w/ the Magic Cup supplement.       SLP Plan  Continue with current plan of care     Recommendations  Diet recommendations: Dysphagia 1 (puree);Honey-thick liquid Liquids provided via: Teaspoon Medication Administration: Crushed with puree Supervision: Full supervision/cueing for compensatory strategies;Trained caregiver to feed patient Compensations: Minimize environmental distractions;Slow rate;Small sips/bites;Follow solids with liquid (check for oral clearing) Postural Changes and/or Swallow Maneuvers: Seated upright 90 degrees;Upright 30-60 min after meal             General  recommendations:  (Palliative Care consult) Oral Care Recommendations: Oral care BID;Staff/trained caregiver to provide oral care Follow up Recommendations: Skilled Nursing facility (TBD) Plan: Continue with current plan of care     GO                [name], MS, CCC-SLP  [name] 09/22/2015, 12:07 PM

## 2015-09-23 DIAGNOSIS — N179 Acute kidney failure, unspecified: Secondary | ICD-10-CM

## 2015-09-23 DIAGNOSIS — Z515 Encounter for palliative care: Secondary | ICD-10-CM

## 2015-09-23 DIAGNOSIS — I4891 Unspecified atrial fibrillation: Secondary | ICD-10-CM

## 2015-09-23 DIAGNOSIS — R634 Abnormal weight loss: Secondary | ICD-10-CM

## 2015-09-23 DIAGNOSIS — R269 Unspecified abnormalities of gait and mobility: Secondary | ICD-10-CM

## 2015-09-23 DIAGNOSIS — J690 Pneumonitis due to inhalation of food and vomit: Secondary | ICD-10-CM

## 2015-09-23 DIAGNOSIS — F0390 Unspecified dementia without behavioral disturbance: Secondary | ICD-10-CM

## 2015-09-23 DIAGNOSIS — R4182 Altered mental status, unspecified: Secondary | ICD-10-CM | POA: Insufficient documentation

## 2015-09-23 DIAGNOSIS — J9601 Acute respiratory failure with hypoxia: Secondary | ICD-10-CM

## 2015-09-23 DIAGNOSIS — E860 Dehydration: Secondary | ICD-10-CM

## 2015-09-23 DIAGNOSIS — A419 Sepsis, unspecified organism: Principal | ICD-10-CM

## 2015-09-23 DIAGNOSIS — E870 Hyperosmolality and hypernatremia: Secondary | ICD-10-CM

## 2015-09-23 DIAGNOSIS — R600 Localized edema: Secondary | ICD-10-CM

## 2015-09-23 DIAGNOSIS — I471 Supraventricular tachycardia: Secondary | ICD-10-CM

## 2015-09-23 LAB — BASIC METABOLIC PANEL
Anion gap: 3 — ABNORMAL LOW (ref 5–15)
BUN: 24 mg/dL — AB (ref 6–20)
CO2: 29 mmol/L (ref 22–32)
CREATININE: 0.55 mg/dL (ref 0.44–1.00)
Calcium: 8 mg/dL — ABNORMAL LOW (ref 8.9–10.3)
Chloride: 116 mmol/L — ABNORMAL HIGH (ref 101–111)
GFR calc Af Amer: >60 mL/min (ref >60)
Glucose, Bld: 128 mg/dL — ABNORMAL HIGH (ref 65–99)
Potassium: 3.3 mmol/L — ABNORMAL LOW (ref 3.5–5.1)
SODIUM: 148 mmol/L — AB (ref 135–145)

## 2015-09-23 LAB — CBC
HCT: 32.7 % — ABNORMAL LOW (ref 35.0–47.0)
Hemoglobin: 10.7 g/dL — ABNORMAL LOW (ref 12.0–16.0)
MCH: 27.4 pg (ref 26.0–34.0)
MCHC: 32.7 g/dL (ref 32.0–36.0)
MCV: 83.6 fL (ref 80.0–100.0)
PLATELETS: 216 10*3/uL (ref 150–440)
RBC: 3.91 MIL/uL (ref 3.80–5.20)
RDW: 18.2 % — AB (ref 11.5–14.5)
WBC: 16.6 10*3/uL — ABNORMAL HIGH (ref 3.6–11.0)

## 2015-09-23 MED ORDER — DEXTROSE-NACL 5-0.45 % IV SOLN: INTRAVENOUS | Status: DC

## 2015-09-23 MED ORDER — BISACODYL 10 MG RE SUPP: 10.0000 mg | RECTAL | Status: AC | PRN

## 2015-09-23 MED ORDER — POLYVINYL ALCOHOL 1.4 % OP SOLN: 1.0000 [drp] | OPHTHALMIC | Status: AC | PRN

## 2015-09-23 MED ORDER — MORPHINE SULFATE (CONCENTRATE) 10 MG /0.5 ML PO SOLN: 5.0000 mg | ORAL | Status: AC | PRN

## 2015-09-23 MED ORDER — AMLODIPINE BESYLATE 5 MG PO TABS
5.0000 mg | ORAL_TABLET | Freq: Every day | ORAL | Status: DC
  Administered 2015-09-23: 5 mg via ORAL
  Filled 2015-09-23: qty 1

## 2015-09-23 MED ORDER — LORAZEPAM 0.5 MG PO TABS: 0.5000 mg | ORAL_TABLET | ORAL | Status: AC | PRN

## 2015-09-23 MED ORDER — POTASSIUM CHLORIDE 10 MEQ/100ML IV SOLN
10.0000 meq | INTRAVENOUS | Status: AC
  Administered 2015-09-23 (×4): 10 meq via INTRAVENOUS
  Filled 2015-09-23 (×4): qty 100

## 2015-09-23 MED ORDER — MORPHINE SULFATE (CONCENTRATE) 10 MG/0.5ML PO SOLN: 5.0000 mg | ORAL | Status: DC | PRN

## 2015-09-23 MED ORDER — PROCHLORPERAZINE 25 MG RE SUPP: 25.0000 mg | Freq: Two times a day (BID) | RECTAL | Status: AC | PRN

## 2015-09-23 MED ORDER — POLYVINYL ALCOHOL 1.4 % OP SOLN
1.0000 [drp] | OPHTHALMIC | Status: DC | PRN
  Filled 2015-09-23: qty 15

## 2015-09-23 MED ORDER — GLYCOPYRROLATE 1 MG PO TABS: 1.0000 mg | ORAL_TABLET | Freq: Three times a day (TID) | ORAL | Status: AC | PRN

## 2015-09-23 NOTE — Progress Notes (Signed)
CSW was informed by RN that patient's daughters wanted to speak to me. CSW met with patient's daughters at bedside. Inquired about patient being able to return to [facility]. CSW informed patient's daughter that patient is not able to return to [facility] on High Flow Nasal Cannula. Patient's family inquired about Palliative Care. CSW informed them that a consult has been placed. CSW encouraged patient's daughters to contact her if they had any other questions. CSW will continue to follow and assist.   Ernest Pine, MSW, [place] Work Department [phone]

## 2015-09-23 NOTE — Progress Notes (Signed)
Family asking to see palliative MD. Dr. Orvan Falconerampbell called and MD reports she will be around in 20-30 minutes. Family updated.

## 2015-09-23 NOTE — Progress Notes (Signed)
[facility] - [place] at [facility]   PATIENT NAME: Destiny Gutierrez    MR#:  [id number]  DATE OF BIRTH:  05-25-1925  SUBJECTIVE:  CHIEF COMPLAINT:   Chief Complaint  Patient presents with  . Shortness of Breath   -    REVIEW OF SYSTEMS:  Review of Systems  Unable to perform ROS: dementia    DRUG ALLERGIES:   Allergies  Allergen Reactions  . Codeine   . Oxycodone   . Penicillins   . Sulfa Antibiotics     VITALS:  Blood pressure 148/73, pulse 78, temperature 97.9 F (36.6 C), temperature source Oral, resp. rate 18, height  (1.651 m), weight 69.174 kg (152 lb 8 oz), SpO2 97 %.  PHYSICAL EXAMINATION:  Physical Exam  GENERAL:  82 y.o.-year-old patient lying in the bed, ill appearing. On high flow nasal cannula Appears very weak and not interacting today EYES: Pupils equal, round, reactive to light and accommodation. No scleral icterus. Extraocular muscles intact.  HEENT: Head atraumatic, normocephalic. Oropharynx and nasopharynx clear. Oral thrush noted NECK:  Supple, no jugular venous distention. No thyroid enlargement, no tenderness.  LUNGS: Coarse breath sounds bilaterally with scattered rhonchi. no wheezing, rales or crepitation. No use of accessory muscles of respiration.  CARDIOVASCULAR: S1, S2 normal. No rubs, or gallops. 3/6 systolic murmur is present ABDOMEN: Soft, nontender, nondistended. Bowel sounds present. No organomegaly or mass.  Foley catheter in place. EXTREMITIES: No pedal edema, cyanosis, or clubbing.  Right hand bruised and flexed at the wrist- improved swelling of the right hand.. Right leg is internally rotated and shortened due to history of femoral fracture NEUROLOGIC: Alert and not following commands today. Unable to move the right leg much due to her previous fracture and internal rotation. PSYCHIATRIC: The patient is alert. Seems to be oriented to self at this time. SKIN: No obvious rash, lesion, or ulcer.     LABORATORY PANEL:   CBC  Recent Labs Lab 09/23/15 0442  WBC 16.6*  HGB 10.7*  HCT 32.7*  PLT 216   ------------------------------------------------------------------------------------------------------------------  Chemistries   Recent Labs Lab 09/19/15 1430  09/20/15 0433  09/23/15 0442  NA 156*  < >  --   < > 148*  K 3.1*  < >  --   < > 3.3*  CL 113*  < >  --   < > 116*  CO2 30  < >  --   < > 29  GLUCOSE 198*  < >  --   < > 128*  BUN 49*  < >  --   < > 24*  CREATININE 1.10*  < >  --   < > 0.55  CALCIUM 9.1  < >  --   < > 8.0*  MG  --   --  2.9*  --   --   AST 27  --   --   --   --   ALT 15  --   --   --   --   ALKPHOS 124  --   --   --   --   BILITOT 1.1  --   --   --   --   < > = values in this interval not displayed. ------------------------------------------------------------------------------------------------------------------  Cardiac Enzymes  Recent Labs Lab 09/20/15 0024  TROPONINI 0.15*   ------------------------------------------------------------------------------------------------------------------  RADIOLOGY:  Dg Chest Port 1 View  09/21/2015  CLINICAL DATA:  Acute hypoxic respiratory failure. Aspiration pneumonia. EXAM: PORTABLE CHEST 1  VIEW COMPARISON:  September 21, 2015. FINDINGS: Stable cardiomediastinal silhouette. No pneumothorax is noted. No significant pleural effusion is noted. Atherosclerosis of descending thoracic aorta is noted. Stable right infrahilar opacity is noted concerning for possible pneumonia. Mildly increased left basilar subsegmental atelectasis is noted. Bony thorax is unremarkable. IMPRESSION: Stable right infrahilar opacity is noted concerning for pneumonia. Mildly increased left basilar subsegmental atelectasis is noted. Electronically Signed   By: Lupita RaiderJames  Green Jr, M.D.   On: 09/21/2015 13:51    EKG:   Orders placed or performed during the hospital encounter of 09/19/15  . ED EKG  . ED EKG  . EKG 12-Lead  . EKG  12-Lead    ASSESSMENT AND PLAN:   82 year old female with past medical history of dementia, osteoarthritis, history of right knee replacement, who presents to the hospital due to altered mental status and in acute respiratory failure.  #1 Metabolic encephalopathy secondary to the sepsis, hypernatremia -CT head with diffuse cortical atrophy- consistent with her dementia- no acute findings. - cont trt of sepsis with IV ABX and D5W for increased sodium - Slowly improving. Patient is alert but extremely weak - At baseline she is total care dependent, has dementia, but able to maintain conversation with family.  #2 sepsis-on admission, likely from aspiration pneumonia - on high flow nasal cannula -Blood and urine cultures are negative so far. One set of blood cultures growing coagulate negative staph. Likely contaminant. On meropenem.  #3 acute hypoxic respiratory failure-likely from aspiration pneumonia. -Remains on high flow nasal cannula. Chest x-ray showing right infrahilar infiltrate, likely consistent with aspiration pneumonia. -on 60% fio2 today, better than yesterday but overall appearing globally weak -Minimal improvement in white count. Antibiotics changed to meropenem to cover anaerobics. - Speech therapy consult requested.  Now started on pured diet with honey thick liquids.  #3 Tachycardia- afib with rvr- known h/o paroxysmal afib once in past - HR improved on metoprolol. - ECHO normal EF 55%  #4 Hypernatremia - due to dehydration and poor by mouth intake. -Improving sodium,  On D5W.  #5 Acute renal failure- ATN, due to dehydration and poor by mouth intake and also underlying sepsis. - Resolved   #6 Hypokalemia- replaced, monitor  #7 Right hand pain and bruise- subcutaneous edema from trauma may be. But no fracture. Continue ice pack and keep the arm elevated. Improving  #8 DVT Prophylaxis- SQ Heparin  #9 Elevated troponin- demand ischemia, stable   Overall poor  prognosis if does not improve in next 1-2 days. Discussed with daughter at bedside Ordered palliative care consult.   All the records are reviewed and case discussed with ED provider. Management plans discussed with the patient, family and they are in agreement.  CODE STATUS: DNR   All the records are reviewed and case discussed with Care Management/Social Workerr. Management plans discussed with the patient, family and they are in agreement.   TOTAL TIME SPENT IN TAKING CARE OF THIS PATIENT:  32 minutes.   POSSIBLE D/C IN 2-3 DAYS, DEPENDING ON CLINICAL CONDITION.   Enid BaasKALISETTI,Daana Petrasek M.D on 09/23/2015 at 12:43 PM  Between 7am to 6pm - Pager - [phone]  After 6pm go to [email or web address] - password [email or web address]  [facility]  Office  [phone]  CC: Primary care physician; Dorothey BasemanAVID BRONSTEIN, MD

## 2015-09-23 NOTE — Progress Notes (Addendum)
Telemetry removed and returned to secretary desk per palliative order. Dining notified to send dinner tray.

## 2015-09-23 NOTE — Progress Notes (Signed)
   09/23/15 1830  Clinical Encounter Type  Visited With Patient and family together  Visit Type Follow-up  Referral From Nurse  Consult/Referral To Chaplain  Spiritual Encounters  Spiritual Needs Ritual;Prayer  Stress Factors  Patient Stress Factors Health changes;Exhausted;Major life changes  Family Stress Factors Major life changes  Met w/patient & family. Provided grief support for family, Vonzell Schlatter for patient, and prayer. Patient is transitioning to hospice & comfort care. Chap. [place] Beem G. [place]

## 2015-09-23 NOTE — Consult Note (Addendum)
Palliative Medicine Inpatient Consult Note   Name: Destiny Gutierrez Date: 09/23/2015 MRN: [id number]  DOB: 08/28/1924  Referring Physician: Enid Baas, MD  Palliative Care consult requested for this 82 y.o. female for goals of medical therapy in patient with sepsis thought to be due to aspiration pneumonia and associated altered mental status.  TODAY'S DISCUSSIONS AND DECISIONS: Pt is DNR. She also had stated she would never want to be on a breathing machine or have a feeding tube.  Pts two daughters fell pt is approaching end-of-life quickly now and they want to allow their mother to pass away instead of stopping this natural process.  They are compassionate and appropriate. Pt will be tapered down off the Hi Flow in the am and at that time, pt will have some morphine so this can treat air hunger prior to taking off the Hi Flow. Daughter, Claris Che, had already called someone at [facility] (just prior to me meeting the daughters today).  Daughters Aurea Graff and Claris Che, are aware of the comfort terminal approach at [facility].  They understand that there are other [facility] but they want this one in [place]. They do not want pt to return to the SNF but requested [facility] admission specifically. They were informed that pt could go back to the facility with Hospice, but they prefer [facility] admission.  Will plan to get pt off of the Hi Flow tomorrow am/ day.  If she is stable --will call for Hospice Home consult and hope to tranfer pt there tomorrow.  I have already done the DC med recs in anticipation of this.  I have paged hospitalist but have not gotten a call back yet.  Nursing is informed of new comfort care orders.   BRIEF HISTORY: Pt is 82 yo and has lived in a SNF facility for a little over a year now. She came in with a cough and has been diagnosed with sepsis due to a pneumonia thought to be aspiration related. She is eating some daily but must have honey thick liquids.   She is on IV ABX.  She developed a tachycardia identified as AFIB with RVR and she was moved to a unit where to she could be on telemetry. When the daughters were called about the IV meds to treat this and about pt being moved, they both started reflecting about how much their mother would really prefer not to have any life-sustaining interventions now.  Pt has a poor quality of life and cannot really move her legs. She quit walking about 3 yrs ago but her dementia is not quite at end stage since she still can talk a bit and recognize family.  Her non-ambulatory status is multifactorial in etiology --and is not due to dementia alone. Daughters feel that pt is telling them to 'let her go'. The pt is also not really responding to aggressive ABX and she has had to be placed on Hi Flow oxygen.  She is currently having O2 sats of 96 % on 35 LPM and 55 % (tapered down a bit from yesterday).  Other VS are stable.   One blood cx was growing coag neg staph.   She is getting a pureed diet with Honey Thick liquids and she takes in a modest amount, but not a lot of food. Her albumin is low at 2.7 and daughters know she has lost 'a lot of weight' but it is unclear how much.  Dr Nemiah Commander had told the daughters that the  pt is not really improving very much if at all and they might wish to pursue palliative or even comfort terminal care.  All on her care team feel pt is at the end of life--though she does eat / drink some with much help.    One daughter Aurea Graff(Joan) says she is HCPOA. She will bring the form here.  Both of pt's daughters are here and are in agreement that they no longer want any treatment done that will prolong their mother's life. They feel she is asking to be allowed to die. The pt has actually been saying things to this effect for weeks now.  There is a son, who is in [place] and who has agreed with the daughters about pursuing a comfort type of approach to care at this time. Another son was adopted out as a  child and he is not to be called per daughters.   Pt grimaces fairly often per family.  She also moans sometimes.  This is pain from being bedbound.  Pt has not been mobile now for at least a couple of years.  She hurts in her joints all the time. The reported allergy to codeine or oxycodone was really a side effect (these made her dizzy).  She is felt to be getting very close to the end of her life due to her hypoxia and sepsis.  She is quite malnourished and has an advancing pneumonia due to aspiration. Family knows she would not want continued treatments or a feeding tube, etc.     IMPRESSION Sepsis ---due to pneumonia thought to be aspiration related ---one bld cx needs further follow up Acute Hypoxic Resp Flre due to pneumonia  --remains of Hi Flow O2  (55 % at 35 Liters) Tachycardia with afib RVR --now in NSR Hypernatremia due to dehydration and poor oral intake ---on D%W Acute Renal Failure due to ATN and dehydration Hypokalemia R hand bruise (no fx) Dyphagia  ---on Dys 1 with honey thick liquids via teaspoon.  Malnutrition --moderately severe and present on admission. Bruised hand --no fracture ( I am unclear on how this was injured) Vit D Deficiency Moderately Severe Dementia  Gait disorder  H/O UTIs DJD Former smoker of 1 ppd for 20 yrs.   REVIEW OF SYSTEMS:  Patient is not able to provide ROS due to illness  SPIRITUAL SUPPORT SYSTEM: Yes.  SOCIAL HISTORY:  reports that she has quit smoking. Her smoking use included Cigarettes. She has a 20 pack-year smoking history. She does not have any smokeless tobacco history on file. She reports that she does not drink alcohol or use illicit drugs. At baseline she is dependent on others for total care but has been able to maintain some conversations with family despite her advancing dementia.    LEGAL DOCUMENTS:  none  CODE STATUS: DNR  PAST MEDICAL HISTORY: Past Medical History  Diagnosis Date  . Dementia   . Vitamin D  deficiency   . MDD (major depressive disorder) (HCC)   . UTI (lower urinary tract infection)   . Osteoarthritis   . Knee joint replacement status     PAST SURGICAL HISTORY: No past surgical history on file.  ALLERGIES:  is allergic to codeine; oxycodone; penicillins; and sulfa antibiotics.  MEDICATIONS:  Current Facility-Administered Medications  Medication Dose Route Frequency Provider Last Rate Last Dose  . acetaminophen (TYLENOL) tablet 650 mg  650 mg Oral Q6H PRN Houston SirenVivek J Sainani, MD   650 mg at 09/21/15 1802  Or  . acetaminophen (TYLENOL) suppository 650 mg  650 mg Rectal Q6H PRN Houston Siren, MD   650 mg at 09/19/15 1836  . amLODipine (NORVASC) tablet 5 mg  5 mg Oral Daily Enid Baas, MD   5 mg at 09/23/15 0837  . antiseptic oral rinse (CPC / CETYLPYRIDINIUM CHLORIDE 0.05%) solution 7 mL  7 mL Mouth Rinse q12n4p Houston Siren, MD   7 mL at 09/23/15 1239  . chlorhexidine (PERIDEX) 0.12 % solution 15 mL  15 mL Mouth Rinse BID Houston Siren, MD   15 mL at 09/23/15 1191  . dextrose 5 %-0.45 % sodium chloride infusion   Intravenous Continuous Enid Baas, MD 50 mL/hr at 09/23/15 1431    . enoxaparin (LOVENOX) injection 40 mg  40 mg Subcutaneous Q24H Enid Baas, MD   40 mg at 09/23/15 1238  . fluconazole (DIFLUCAN) IVPB 100 mg  100 mg Intravenous Q24H Enid Baas, MD   100 mg at 09/23/15 1350  . FLUoxetine (PROZAC) capsule 10 mg  10 mg Oral Daily Enid Baas, MD   10 mg at 09/23/15 0836  . meropenem (MERREM) 1 g in sodium chloride 0.9 % 100 mL IVPB  1 g Intravenous Q12H Enid Baas, MD   1 g at 09/23/15 0945  . metoprolol tartrate (LOPRESSOR) tablet 25 mg  25 mg Oral TID Enid Baas, MD   25 mg at 09/23/15 0653  . ondansetron (ZOFRAN) tablet 4 mg  4 mg Oral Q6H PRN Houston Siren, MD       Or  . ondansetron (ZOFRAN) injection 4 mg  4 mg Intravenous Q6H PRN Houston Siren, MD      . tobramycin (TOBREX) 0.3 % ophthalmic solution 1 drop   1 drop Both Eyes 6 times per day Enid Baas, MD   1 drop at 09/23/15 1239  . tobramycin-dexamethasone (TOBRADEX) ophthalmic ointment   Both Eyes BID Enid Baas, MD        Vital Signs: BP 148/73 mmHg  Pulse 78  Temp(Src) 97.9 F (36.6 C) (Oral)  Resp 18  Ht  (1.651 m)  Wt 69.174 kg (152 lb 8 oz)  BMI 25.38 kg/m2  SpO2 96% Filed Weights   09/19/15 1539 09/19/15 1751 09/21/15 0001  Weight: 63.7 kg (140 lb 6.9 oz) 67.2 kg (148 lb 2.4 oz) 69.174 kg (152 lb 8 oz)    Estimated body mass index is 25.38 kg/(m^2) as calculated from the following:   Height as of this encounter:  (1.651 m).   Weight as of this encounter: 69.174 kg (152 lb 8 oz).  PERFORMANCE STATUS (ECOG) : 4 - Bedbound  PHYSICAL EXAM: Lethargic but eyes are partly open Lying in bed semi-awake with tele wires in place Nares have HI FLOW nasal cannulas in place OP clear Neck w/o JVD or TM Hrt irreg irreg  Lungs with coughing and ronchi Abd soft and NT Ext no mottling or cyanosis at this time Ankle edema is 1plus bilat.  She blinks eyes once but does not converse during my visit       LABS: CBC:    Component Value Date/Time   WBC 16.6* 09/23/2015 0442   WBC 11.8* 08/10/2012 1800   HGB 10.7* 09/23/2015 0442   HGB 10.8* 08/10/2012 1800   HCT 32.7* 09/23/2015 0442   HCT 35.0 08/10/2012 1800   PLT 216 09/23/2015 0442   PLT 831* 08/10/2012 1800   MCV 83.6 09/23/2015 0442   MCV 85 08/10/2012  1800   NEUTROABS 13.7* 01/09/2015 1300   NEUTROABS 8.0* 08/10/2012 1800   LYMPHSABS 1.2 01/09/2015 1300   LYMPHSABS 2.2 08/10/2012 1800   MONOABS 0.8 01/09/2015 1300   MONOABS 1.0* 08/10/2012 1800   EOSABS 0.0 01/09/2015 1300   EOSABS 0.6 08/10/2012 1800   BASOSABS 0.0 01/09/2015 1300   BASOSABS 0.1 08/10/2012 1800   Comprehensive Metabolic Panel:    Component Value Date/Time   NA 148* 09/23/2015 0442   NA 142 08/10/2012 1800   K 3.3* 09/23/2015 0442   K 3.3* 08/10/2012 1800   CL 116*  09/23/2015 0442   CL 105 08/10/2012 1800   CO2 29 09/23/2015 0442   CO2 25 08/10/2012 1800   BUN 24* 09/23/2015 0442   BUN 29* 08/10/2012 1800   CREATININE 0.55 09/23/2015 0442   CREATININE 0.80 08/10/2012 1800   GLUCOSE 128* 09/23/2015 0442   GLUCOSE 140* 08/10/2012 1800   CALCIUM 8.0* 09/23/2015 0442   CALCIUM 8.3* 08/10/2012 1800   AST 27 09/19/2015 1430   ALT 15 09/19/2015 1430   ALKPHOS 124 09/19/2015 1430   BILITOT 1.1 09/19/2015 1430   PROT 7.1 09/19/2015 1430   ALBUMIN 2.7* 09/19/2015 1430    More than 50% of the visit was spent in counseling/coordination of care: Yes  Time Spent: 85 minutes

## 2015-09-23 NOTE — Progress Notes (Signed)
Pt's BP elevated this am. Orders given to administer morning dose of PO metoprolol early. RN will administer and continue to monitor. Syliva Overmanassie A Anoushka Divito, RN

## 2015-09-23 NOTE — Care Management Important Message (Signed)
Important Message  Patient Details  Name: Destiny Gutierrez MRN: [id number] Date of Birth: 09-Nov-1924   Medicare Important Message Given:  Yes    Olegario MessierKathy A Haja Crego 09/23/2015, 10:30 AM

## 2015-09-24 LAB — CULTURE, BLOOD (ROUTINE X 2): Culture: NO GROWTH

## 2015-09-24 NOTE — Progress Notes (Signed)
[facility] [place] [facility]   PATIENT NAME: Destiny Gutierrez    MR#:  [id number]  DATE OF BIRTH:  1925-03-08  SUBJECTIVE:  CHIEF COMPLAINT:   Chief Complaint  Patient presents with  . Shortness of Breath   -  Patient more alert today, taking sips of honey thick water, daughters at bedside - they have decided for hospice home after speaking with palliative care physician - on high flow this morning fio2 at 40%  REVIEW OF SYSTEMS:  Review of Systems  Unable to perform ROS: dementia    DRUG ALLERGIES:   Allergies  Allergen Reactions  . Penicillins   . Sulfa Antibiotics     VITALS:  Blood pressure 168/79, pulse 73, temperature 98.4 F (36.9 C), temperature source Oral, resp. rate 18, height 5\' 5"  (1.651 m), weight 69.174 kg (152 lb 8 oz), SpO2 94 %.  PHYSICAL EXAMINATION:  Physical Exam  GENERAL:  82 y.o.-year-old patient lying in the bed, ill appearing. On high flow nasal cannula Appears very weak but more alert EYES: Pupils equal, round, reactive to light and accommodation. No scleral icterus. Extraocular muscles intact.  HEENT: Head atraumatic, normocephalic. Oropharynx and nasopharynx clear. Oral thrush noted NECK:  Supple, no jugular venous distention. No thyroid enlargement, no tenderness.  LUNGS: Coarse breath sounds bilaterally with scattered rhonchi. no wheezing, rales or crepitation. No use of accessory muscles of respiration.  CARDIOVASCULAR: S1, S2 normal. No rubs, or gallops. 3/6 systolic murmur is present ABDOMEN: Soft, nontender, nondistended. Bowel sounds present. No organomegaly or mass.  Foley catheter in place. EXTREMITIES: No pedal edema, cyanosis, or clubbing.  Right hand bruised and flexed at the wrist- improved swelling of the right hand.. Right leg is internally rotated and shortened due to history of femoral fracture NEUROLOGIC: Alert and following commands today. Unable to move the right leg much due to her previous  fracture and internal rotation. PSYCHIATRIC: The patient is alert. Seems to be oriented x2 at this time. SKIN: No obvious rash, lesion, or ulcer.    LABORATORY PANEL:   CBC  Recent Labs Lab 09/23/15 0442  WBC 16.6*  HGB 10.7*  HCT 32.7*  PLT 216   ------------------------------------------------------------------------------------------------------------------  Chemistries   Recent Labs Lab 09/19/15 1430  09/20/15 0433  09/23/15 0442  NA 156*  < >  --   < > 148*  K 3.1*  < >  --   < > 3.3*  CL 113*  < >  --   < > 116*  CO2 30  < >  --   < > 29  GLUCOSE 198*  < >  --   < > 128*  BUN 49*  < >  --   < > 24*  CREATININE 1.10*  < >  --   < > 0.55  CALCIUM 9.1  < >  --   < > 8.0*  MG  --   --  2.9*  --   --   AST 27  --   --   --   --   ALT 15  --   --   --   --   ALKPHOS 124  --   --   --   --   BILITOT 1.1  --   --   --   --   < > = values in this interval not displayed. ------------------------------------------------------------------------------------------------------------------  Cardiac Enzymes  Recent Labs Lab 09/20/15 0024  TROPONINI 0.15*   ------------------------------------------------------------------------------------------------------------------  RADIOLOGY:  No results found.  EKG:   Orders placed or performed during the hospital encounter of 09/19/15  . ED EKG  . ED EKG  . EKG 12-Lead  . EKG 12-Lead    ASSESSMENT AND PLAN:   82 year old female with past medical history of dementia, osteoarthritis, history of right knee replacement, who presents to the hospital due to altered mental status and in acute respiratory failure.  Being in the ICU, treated for her hypernatremia and also sepsis and hypoxic respiratory failure. She was transitioned to high flow nasal cannula 2 days ago. Chest x-ray with right infrahilar infiltrate, possible aspiration pneumonia. She is on broad-spectrum antibiotics at this time. However patient appears globally  weak, waxing and waning mental status. Palliative care was consulted and after extensive discussion with the family, she was made comfort care. Family wants her to be transferred to hospice home. She has the following diagnoses.:  #1 Metabolic encephalopathy secondary to the sepsis, hypernatremia -CT head with diffuse cortical atrophy- consistent with her dementia- no acute findings.  #2 sepsis-on admission, likely from aspiration pneumonia -Blood and urine cultures are negative so far.   #3 acute hypoxic respiratory failure-likely from aspiration pneumonia. -Remains on high flow nasal cannula. Chest x-ray showing right infrahilar infiltrate, likely consistent with aspiration pneumonia. - on 40% fio2 this morning- will transition to nasal cannula now. Now started on pured diet with honey thick liquids.  #3  afib with rvr- known h/o paroxysmal afib once in past - ECHO normal EF 55% On metoprolol  #4 Hypernatremia -   #5 Acute renal failure- ATN,  #6 Hypokalemia-  #7 Depression- prozac  #9 Elevated troponin- demand ischemia,   Patient is alert taking sips of water this morning. Will transition from high flow nasal cannula to 4 L nasal cannula. If beds are available at hospice home, discharged to hospice home today.  Appreciate palliative care consult   All the records are reviewed and case discussed with ED provider. Management plans discussed with the patient, family and they are in agreement.  CODE STATUS: DNR   All the records are reviewed and case discussed with Care Management/Social Workerr. Management plans discussed with the patient, family and they are in agreement.   TOTAL TIME SPENT IN TAKING CARE OF THIS PATIENT:  28 minutes.   POSSIBLE D/C TODAY, DEPENDING ON CLINICAL CONDITION.   Enid Baas M.D on 09/24/2015 at 9:08 AM  Between 7am to 6pm - Pager - [phone]  After 6pm go to [email or web address] - password [email or web address] [facility]  [facility]   Office  [phone]  CC: Primary care physician; Dorothey Baseman, MD

## 2015-09-24 NOTE — Progress Notes (Signed)
Palliative Medicine Inpatient Consult Follow Up Note   Name: Destiny Gutierrez Date: 09/24/2015 MRN: [id number]  DOB: [birth year]  Referring Physician: Enid Baasadhika Kalisetti, MD  Palliative Care consult requested for this 82 y.o. female for goals of medical therapy in patient with aspiration pneumonia and altered mentation due to illness.   TODAY'S DISCUSSIONS AND DECISIONS: Pt is 'a little better today' in that she is able to be off of Hi Flow O2 and she woke up very briefly and took some sips of Honey-thick liquids.  She is still very lethargic and weak and is still felt to be appropriate for Hospice Home.  No furhter Antibiotics are ordered and she will have a Hospice Liason consult soon.  She does eat --some ---but not enough to meet her calorie needs.  She is currently on 4 LPM O2 by nasal cannula.    IMPRESSION Sepsis ---due to pneumonia thought to be aspiration related ---one bld cx needs further follow up Acute Hypoxic Resp Flre due to pneumonia --remains of Hi Flow O2 (55 % at 35 Liters) Tachycardia with afib RVR --now in NSR Hypernatremia due to dehydration and poor oral intake ---on D%W Acute Renal Failure due to ATN and dehydration Hypokalemia R hand bruise (no fx) Dyphagia  ---on Dys 1 with honey thick liquids via teaspoon.  Malnutrition --moderately severe and present on admission. Bruised hand --no fracture ( I am unclear on how this was injured) Vit D Deficiency Moderately Severe Dementia Gait disorder  H/O UTIs DJD Former smoker of 1 ppd for 20 yrs.     REVIEW OF SYSTEMS:  Patient is not able to provide ROS due to illness and dementia  CODE STATUS: DNR   PAST MEDICAL HISTORY: Past Medical History  Diagnosis Date  . Dementia   . Vitamin D deficiency   . MDD (major depressive disorder) (HCC)   . UTI (lower urinary tract infection)   . Osteoarthritis   . Knee joint replacement status     PAST SURGICAL HISTORY: No past surgical history on  file.  Vital Signs: BP 168/79 mmHg  Pulse 73  Temp(Src) 98.4 F (36.9 C) (Oral)  Resp 18  Ht 5\' 5"  (1.651 m)  Wt 69.174 kg (152 lb 8 oz)  BMI 25.38 kg/m2  SpO2 94% Filed Weights   09/19/15 1539 09/19/15 1751 09/21/15 0001  Weight: 63.7 kg (140 lb 6.9 oz) 67.2 kg (148 lb 2.4 oz) 69.174 kg (152 lb 8 oz)    Estimated body mass index is 25.38 kg/(m^2) as calculated from the following:   Height as of this encounter: 5\' 5"  (1.651 m).   Weight as of this encounter: 69.174 kg (152 lb 8 oz).  PHYSICAL EXAM: Lethargic with a towel wrapped around her neck and nasal cannula in place now at 4 LPM Eyes are closed --she will open them with a lot of stimulation Nares patent , Lips wnl Neck w/o JVD or TM Hrt rrr no m --rate 102 Lungs with ronchi and few rales in bases bilaterally Abd soft and NT Ext no mottling or cyanosis Skin is warm  LABS: CBC:    Component Value Date/Time   WBC 16.6* 09/23/2015 0442   WBC 11.8* 08/10/2012 1800   HGB 10.7* 09/23/2015 0442   HGB 10.8* 08/10/2012 1800   HCT 32.7* 09/23/2015 0442   HCT 35.0 08/10/2012 1800   PLT 216 09/23/2015 0442   PLT 831* 08/10/2012 1800   MCV 83.6 09/23/2015 0442   MCV 85 08/10/2012 1800  NEUTROABS 13.7* 01/09/2015 1300   NEUTROABS 8.0* 08/10/2012 1800   LYMPHSABS 1.2 01/09/2015 1300   LYMPHSABS 2.2 08/10/2012 1800   MONOABS 0.8 01/09/2015 1300   MONOABS 1.0* 08/10/2012 1800   EOSABS 0.0 01/09/2015 1300   EOSABS 0.6 08/10/2012 1800   BASOSABS 0.0 01/09/2015 1300   BASOSABS 0.1 08/10/2012 1800   Comprehensive Metabolic Panel:    Component Value Date/Time   NA 148* 09/23/2015 0442   NA 142 08/10/2012 1800   K 3.3* 09/23/2015 0442   K 3.3* 08/10/2012 1800   CL 116* 09/23/2015 0442   CL 105 08/10/2012 1800   CO2 29 09/23/2015 0442   CO2 25 08/10/2012 1800   BUN 24* 09/23/2015 0442   BUN 29* 08/10/2012 1800   CREATININE 0.55 09/23/2015 0442   CREATININE 0.80 08/10/2012 1800   GLUCOSE 128* 09/23/2015 0442    GLUCOSE 140* 08/10/2012 1800   CALCIUM 8.0* 09/23/2015 0442   CALCIUM 8.3* 08/10/2012 1800   AST 27 09/19/2015 1430   ALT 15 09/19/2015 1430   ALKPHOS 124 09/19/2015 1430   BILITOT 1.1 09/19/2015 1430   PROT 7.1 09/19/2015 1430   ALBUMIN 2.7* 09/19/2015 1430     More than 50% of the visit was spent in counseling/coordination of care: YES  Time Spent:  25 min

## 2015-09-24 NOTE — Progress Notes (Signed)
New hospice home referral received from [facility] Gutierrez following a Palliative care consult with Dr. Megan Salon. Destiny Gutierrez is an 82 year old woman admitted from [facility] for evaluation of cough, she has been treated for sepsis due to pneumonia with IV antibiotics as well as Hi flo oxygen. Her condition has not improved and her daughters have chosen to focus on her comfort with transfer to the hospice home. She was weaned from hi flo oxygen to 4 liters nasal cannula and appears comfortable.  Writer met in the patient's room with her daughters Destiny Gutierrez) and Destiny Gutierrez to initiate education regarding hospice services, philosophy and team approach to care with good understanding voiced by both. Questions answered. Consents signed. Patient did respond to voice when spoken to, but fell quickly back to sleep. Information faxed to referral intake, report called to the hospice home, EMS notified for transport. Hospital care team all aware of and in agreement with transfer via EMS to the hospice home with signed portable DNR in place. Thank you the opportunity to be involved in the care of this patient. Flo Shanks RN, BSN, [place] and [facility] [place], [facility] Gutierrez [phone] c

## 2015-09-24 NOTE — Progress Notes (Signed)
Pt being discharged to hospice home via EMS at this time, pt resting comfortably with no complaints of pain, no distress or discomfort noted, family at bedside

## 2015-09-24 NOTE — Discharge Summary (Signed)
[facility] [place] [facility]   PATIENT NAME: Destiny Gutierrez    MR#:  [id number]  DATE OF BIRTH:  11/13/1924  DATE OF ADMISSION:  09/19/2015 ADMITTING PHYSICIAN: Houston SirenVivek J Sainani, MD  DATE OF DISCHARGE: 09/24/2015  PRIMARY CARE PHYSICIAN: Dorothey BasemanAVID BRONSTEIN, MD    ADMISSION DIAGNOSIS:  Altered mental status [R41.82] Sepsis, due to unspecified organism (HCC) [A41.9]  DISCHARGE DIAGNOSIS:  Active Problems:   Sepsis (HCC)   Pressure ulcer   Altered mental status   SECONDARY DIAGNOSIS:   Past Medical History  Diagnosis Date  . Dementia   . Vitamin D deficiency   . MDD (major depressive disorder) (HCC)   . UTI (lower urinary tract infection)   . Osteoarthritis   . Knee joint replacement status     HOSPITAL COURSE:   82 year old female with past medical history of dementia, osteoarthritis, history of right knee replacement, who presents to the hospital due to altered mental status and in acute respiratory failure.  Admitted initially to the ICU, treated for her hypernatremia and also sepsis and hypoxic respiratory failure. She was transitioned to high flow nasal cannula.  -Chest x-ray with right infrahilar infiltrate, possible aspiration pneumonia.  -However patient appears globally weak, waxing and waning mental status.  -Palliative care was consulted and after extensive discussion with the family, she was made comfort care. Family wants her to be transferred to hospice home. She has the following diagnoses.:  #1 Metabolic encephalopathy secondary to the sepsis, hypernatremia -CT head with diffuse cortical atrophy- consistent with her dementia- no acute findings. Waxing and waning mental status now #2 sepsis-on admission, from aspiration pneumonia #3 acute hypoxic respiratory failure-likely from aspiration pneumonia. -on 4L nasal cannula now - on puree diet with honey thick liquids- as tolerated #3 afib with rvr- known h/o paroxysmal afib once  in past - ECHO normal EF 55% #4 Hypernatremia  #5 Acute renal failure- ATN, #6 Hypokalemia #7 Depression #9 Elevated troponin- demand ischemia,   Patient is alert taking sips of water this morning. Transitioned to 4 L nasal cannula. discharge to hospice home today  DISCHARGE CONDITIONS:   Critical  CONSULTS OBTAINED:   Palliative care consult by Dr. Loney LaurenceMargaret Campbell  DRUG ALLERGIES:   Allergies  Allergen Reactions  . Penicillins   . Sulfa Antibiotics     DISCHARGE MEDICATIONS:   Current Discharge Medication List    START taking these medications   Details  bisacodyl (DULCOLAX) 10 MG suppository Place 1 suppository (10 mg total) rectally as needed for moderate constipation. Qty: 6 suppository, Refills: 0    glycopyrrolate (ROBINUL) 1 MG tablet Take 1 tablet (1 mg total) by mouth 3 (three) times daily as needed (secretions). Qty: 15 tablet, Refills: 0    LORazepam (ATIVAN) 0.5 MG tablet Take 1 tablet (0.5 mg total) by mouth every 4 (four) hours as needed for anxiety. Qty: 15 tablet, Refills: 0    Morphine Sulfate (MORPHINE CONCENTRATE) 10 mg / 0.5 ml concentrated solution Take 0.25 mLs (5 mg total) by mouth every 2 (two) hours as needed for severe pain. Qty: 30 mL, Refills: 0    polyvinyl alcohol (LIQUIFILM TEARS) 1.4 % ophthalmic solution Place 1 drop into both eyes as needed for dry eyes. Qty: 15 mL, Refills: 0    prochlorperazine (COMPAZINE) 25 MG suppository Place 1 suppository (25 mg total) rectally every 12 (twelve) hours as needed for nausea or vomiting. Qty: 12 suppository, Refills: 0      CONTINUE these medications  which have NOT CHANGED   Details  acetaminophen (TYLENOL) 325 MG tablet Take 650 mg by mouth every 6 (six) hours as needed.      STOP taking these medications     Eyelid Cleansers (SYSTANE LID WIPES EX)      FLUoxetine (PROZAC) 10 MG capsule      ipratropium-albuterol (DUONEB) 0.5-2.5 (3) MG/3ML SOLN      loratadine (CLARITIN) 10 MG  tablet      Melatonin 3 MG TABS      tobramycin (TOBREX) 0.3 % ophthalmic solution      tobramycin-dexamethasone (TOBRADEX) ophthalmic ointment          DISCHARGE INSTRUCTIONS:   1. Patient will be discharged to hospice home.   If you experience worsening of your admission symptoms, develop shortness of breath, life threatening emergency, suicidal or homicidal thoughts you must seek medical attention immediately by calling 911 or calling your MD immediately  if symptoms less severe.  You Must read complete instructions/literature along with all the possible adverse reactions/side effects for all the Medicines you take and that have been prescribed to you. Take any new Medicines after you have completely understood and accept all the possible adverse reactions/side effects.   Please note  You were cared for by a hospitalist during your hospital stay. If you have any questions about your discharge medications or the care you received while you were in the hospital after you are discharged, you can call the unit and asked to speak with the hospitalist on call if the hospitalist that took care of you is not available. Once you are discharged, your primary care physician will handle any further medical issues. Please note that NO REFILLS for any discharge medications will be authorized once you are discharged, as it is imperative that you return to your primary care physician (or establish a relationship with a primary care physician if you do not have one) for your aftercare needs so that they can reassess your need for medications and monitor your lab values.    Today   CHIEF COMPLAINT:   Chief Complaint  Patient presents with  . Shortness of Breath    VITAL SIGNS:  Blood pressure 168/79, pulse 73, temperature 98.4 F (36.9 C), temperature source Oral, resp. rate 18, height 5\' 5"  (1.651 m), weight 69.174 kg (152 lb 8 oz), SpO2 94 %.  I/O:   Intake/Output Summary (Last 24 hours)  at 09/24/15 1227 Last data filed at 09/23/15 1630  Gross per 24 hour  Intake 204.17 ml  Output      0 ml  Net 204.17 ml    PHYSICAL EXAMINATION:   Physical Exam  GENERAL: 82 y.o.-year-old patient lying in the bed, ill appearing. On high flow nasal cannula- transitioning to nasal cannula Appears very weak but more alert EYES: Pupils equal, round, reactive to light and accommodation. No scleral icterus. Extraocular muscles intact.  HEENT: Head atraumatic, normocephalic. Oropharynx and nasopharynx clear. Oral thrush noted NECK: Supple, no jugular venous distention. No thyroid enlargement, no tenderness.  LUNGS: Coarse breath sounds bilaterally with scattered rhonchi. no wheezing, rales or crepitation. No use of accessory muscles of respiration.  CARDIOVASCULAR: S1, S2 normal. No rubs, or gallops. 3/6 systolic murmur is present ABDOMEN: Soft, nontender, nondistended. Bowel sounds present. No organomegaly or mass.  Foley catheter in place. EXTREMITIES: No pedal edema, cyanosis, or clubbing.  Right hand bruised and flexed at the wrist- improved swelling of the right hand.. Right leg is internally rotated and  shortened due to history of femoral fracture NEUROLOGIC: Alert and following commands today. Unable to move the right leg much due to her previous fracture and internal rotation. PSYCHIATRIC: The patient is alert. Seems to be oriented x2 at this time. SKIN: No obvious rash, lesion, or ulcer.   DATA REVIEW:   CBC  Recent Labs Lab 09/23/15 0442  WBC 16.6*  HGB 10.7*  HCT 32.7*  PLT 216    Chemistries   Recent Labs Lab 09/19/15 1430  09/20/15 0433  09/23/15 0442  NA 156*  < >  --   < > 148*  K 3.1*  < >  --   < > 3.3*  CL 113*  < >  --   < > 116*  CO2 30  < >  --   < > 29  GLUCOSE 198*  < >  --   < > 128*  BUN 49*  < >  --   < > 24*  CREATININE 1.10*  < >  --   < > 0.55  CALCIUM 9.1  < >  --   < > 8.0*  MG  --   --  2.9*  --   --   AST 27  --   --   --   --    ALT 15  --   --   --   --   ALKPHOS 124  --   --   --   --   BILITOT 1.1  --   --   --   --   < > = values in this interval not displayed.  Cardiac Enzymes  Recent Labs Lab 09/20/15 0024  TROPONINI 0.15*    Microbiology Results  Results for orders placed or performed during the hospital encounter of 09/19/15  Blood culture (routine x 2)     Status: None   Collection Time: 09/19/15  1:57 PM  Result Value Ref Range Status   Specimen Description BLOOD RIGHT WRIST  Final   Special Requests BOTTLES DRAWN AEROBIC AND ANAEROBIC 1CC  Final   Culture NO GROWTH 5 DAYS  Final   Report Status 09/24/2015 FINAL  Final  Blood culture (routine x 2)     Status: None   Collection Time: 09/19/15  2:30 PM  Result Value Ref Range Status   Specimen Description BLOOD LEFT WRIST  Final   Special Requests BOTTLES DRAWN AEROBIC AND ANAEROBIC 1CC  Final   Culture  Setup Time   Final    GRAM POSITIVE COCCI CRITICAL RESULT CALLED TO, READ BACK BY AND VERIFIED WITH: Keturah Barre @ 1233 09/20/15 by [facility] AEROBIC BOTTLE ONLY    Culture   Final    Two different species of coagulase negative Staphylococcus isolated. AEROBIC BOTTLE ONLY Results consistent with contamination.    Report Status 09/24/2015 FINAL  Final  Urine culture     Status: None   Collection Time: 09/19/15  2:30 PM  Result Value Ref Range Status   Specimen Description URINE, RANDOM  Final   Special Requests NONE  Final   Culture NO GROWTH 1 DAY  Final   Report Status 09/21/2015 FINAL  Final  Blood Culture ID Panel (Reflexed)     Status: Abnormal   Collection Time: 09/19/15  2:30 PM  Result Value Ref Range Status   Enterococcus species NOT DETECTED NOT DETECTED Final   Vancomycin resistance NOT DETECTED NOT DETECTED Final   Listeria monocytogenes NOT DETECTED NOT DETECTED Final  Staphylococcus species DETECTED (A) NOT DETECTED Final    Comment: CRITICAL RESULT CALLED TO, READ BACK BY AND VERIFIED WITH: Keturah Barre @ 4098 09/20/15 by  [facility]    Staphylococcus aureus NOT DETECTED NOT DETECTED Final   Methicillin resistance DETECTED (A) NOT DETECTED Final    Comment: CRITICAL RESULT CALLED TO, READ BACK BY AND VERIFIED WITH: Keturah Barre @ 1191 09/20/15 by [facility]    Streptococcus species NOT DETECTED NOT DETECTED Final   Streptococcus agalactiae NOT DETECTED NOT DETECTED Final   Streptococcus pneumoniae NOT DETECTED NOT DETECTED Final   Streptococcus pyogenes NOT DETECTED NOT DETECTED Final   Acinetobacter baumannii NOT DETECTED NOT DETECTED Final   Enterobacteriaceae species NOT DETECTED NOT DETECTED Final   Enterobacter cloacae complex NOT DETECTED NOT DETECTED Final   Escherichia coli NOT DETECTED NOT DETECTED Final   Klebsiella oxytoca NOT DETECTED NOT DETECTED Final   Klebsiella pneumoniae NOT DETECTED NOT DETECTED Final   Proteus species NOT DETECTED NOT DETECTED Final   Serratia marcescens NOT DETECTED NOT DETECTED Final   Carbapenem resistance NOT DETECTED NOT DETECTED Final   Haemophilus influenzae NOT DETECTED NOT DETECTED Final   Neisseria meningitidis NOT DETECTED NOT DETECTED Final   Pseudomonas aeruginosa NOT DETECTED NOT DETECTED Final   Candida albicans NOT DETECTED NOT DETECTED Final   Candida glabrata NOT DETECTED NOT DETECTED Final   Candida krusei NOT DETECTED NOT DETECTED Final   Candida parapsilosis NOT DETECTED NOT DETECTED Final   Candida tropicalis NOT DETECTED NOT DETECTED Final  Rapid Influenza A&B Antigens ([facility] only)     Status: None   Collection Time: 09/19/15  2:39 PM  Result Value Ref Range Status   Influenza A ([facility]) NEGATIVE NEGATIVE Final   Influenza B ([facility]) NEGATIVE NEGATIVE Final  MRSA PCR Screening     Status: None   Collection Time: 09/19/15  5:57 PM  Result Value Ref Range Status   MRSA by PCR NEGATIVE NEGATIVE Final    Comment:        The GeneXpert MRSA Assay (FDA approved for NASAL specimens only), is one component of a comprehensive MRSA colonization surveillance program.  It is not intended to diagnose MRSA infection nor to guide or monitor treatment for MRSA infections.     RADIOLOGY:  No results found.  EKG:   Orders placed or performed during the hospital encounter of 09/19/15  . ED EKG  . ED EKG  . EKG 12-Lead  . EKG 12-Lead      Management plans discussed with the patient, family and they are in agreement.  CODE STATUS:     Code Status Orders        Start     Ordered   09/19/15 1803  Do not attempt resuscitation (DNR)   Continuous    Question Answer Comment  In the event of cardiac or respiratory ARREST Do not call a "code blue"   In the event of cardiac or respiratory ARREST Do not perform Intubation, CPR, defibrillation or ACLS   In the event of cardiac or respiratory ARREST Use medication by any route, position, wound care, and other measures to relive pain and suffering. May use oxygen, suction and manual treatment of airway obstruction as needed for comfort.      09/19/15 1802    Code Status History    Date Active Date Inactive Code Status Order ID Comments User Context   This patient has a current code status but no historical code status.    Advance  Directive Documentation        Most Recent Value   Type of Advance Directive  Out of facility DNR (pink MOST or yellow form)   Pre-existing out of facility DNR order (yellow form or pink MOST form)     "MOST" Form in Place?        TOTAL TIME TAKING CARE OF THIS PATIENT: 37 minutes.    Enid Baas M.D on 09/24/2015 at 12:27 PM  Between 7am to 6pm - Pager - [phone]  After 6pm go to [email or web address] - password [email or web address] [facility]  [facility]  Office  [phone]  CC: Primary care physician; Dorothey Baseman, MD

## 2015-09-24 NOTE — Progress Notes (Signed)
Nutrition Brief Note  Chart reviewed. Pt now transitioning to comfort care.  Pt remains on Dysphagia I, Honey thick liquids. Will continue to cater to preferences on this diet order. No further nutrition interventions warranted at this time.  Please re-consult as needed.   Leda QuailAllyson Deshonna Trnka, RD, LDN Pager [phone] Weekend/On-Call Pager [phone]

## 2015-09-24 NOTE — Progress Notes (Signed)
CSW received a consult reporting that patient will discharge [facility] on today 09-24-15. CSW called Jomarie LongsJoseph- admissions coordinator with [facility] to inform him of above. CSW will continue to follow and assist.   Woodroe Modehristina Thomes Burak, MSW, LCSW-A Clinical Social Work Department [phone]

## 2015-09-24 NOTE — Progress Notes (Signed)
Per verbal order from Dr. Nemiah CommanderKalisetti I placed pt. On a 4 L nasal cannula.

## 2015-09-25 NOTE — Clinical Social Work Note (Signed)
Pt discharged to [facility]. Pt's family was agreeable and aware to discharge plan. Hospital Liaison made arrangements for transfer. CSW provided support to pt's family. CSW is signing off as no further needs identified.   Dede QuerySarah Dorn Hartshorne, MSW, LCSW  Clinical Social Worker  [phone]

## 2015-10-08 DIAGNOSIS — F339 Major depressive disorder, recurrent, unspecified: Secondary | ICD-10-CM

## 2015-10-08 DIAGNOSIS — F0150 Vascular dementia without behavioral disturbance: Secondary | ICD-10-CM | POA: Diagnosis not present

## 2015-10-08 DIAGNOSIS — I480 Paroxysmal atrial fibrillation: Secondary | ICD-10-CM | POA: Diagnosis not present

## 2015-10-08 DIAGNOSIS — J9601 Acute respiratory failure with hypoxia: Secondary | ICD-10-CM | POA: Diagnosis not present

## 2015-10-08 DIAGNOSIS — I2510 Atherosclerotic heart disease of native coronary artery without angina pectoris: Secondary | ICD-10-CM | POA: Diagnosis not present

## 2015-10-09 DIAGNOSIS — B351 Tinea unguium: Secondary | ICD-10-CM | POA: Diagnosis not present

## 2015-10-16 DIAGNOSIS — R197 Diarrhea, unspecified: Secondary | ICD-10-CM | POA: Diagnosis not present

## 2015-10-19 DIAGNOSIS — H1010 Acute atopic conjunctivitis, unspecified eye: Secondary | ICD-10-CM | POA: Diagnosis not present

## 2015-11-13 DIAGNOSIS — F22 Delusional disorders: Secondary | ICD-10-CM | POA: Diagnosis not present

## 2015-11-13 DIAGNOSIS — F0150 Vascular dementia without behavioral disturbance: Secondary | ICD-10-CM | POA: Diagnosis not present

## 2015-11-13 DIAGNOSIS — I4891 Unspecified atrial fibrillation: Secondary | ICD-10-CM | POA: Diagnosis not present

## 2015-11-13 DIAGNOSIS — I2510 Atherosclerotic heart disease of native coronary artery without angina pectoris: Secondary | ICD-10-CM | POA: Diagnosis not present

## 2015-12-24 DIAGNOSIS — F339 Major depressive disorder, recurrent, unspecified: Secondary | ICD-10-CM

## 2015-12-24 DIAGNOSIS — I2510 Atherosclerotic heart disease of native coronary artery without angina pectoris: Secondary | ICD-10-CM

## 2015-12-24 DIAGNOSIS — F0150 Vascular dementia without behavioral disturbance: Secondary | ICD-10-CM

## 2015-12-24 DIAGNOSIS — I480 Paroxysmal atrial fibrillation: Secondary | ICD-10-CM | POA: Diagnosis not present

## 2015-12-28 DIAGNOSIS — I679 Cerebrovascular disease, unspecified: Secondary | ICD-10-CM | POA: Diagnosis not present

## 2015-12-28 DIAGNOSIS — N179 Acute kidney failure, unspecified: Secondary | ICD-10-CM

## 2015-12-28 DIAGNOSIS — I480 Paroxysmal atrial fibrillation: Secondary | ICD-10-CM | POA: Diagnosis not present

## 2015-12-28 DIAGNOSIS — J9601 Acute respiratory failure with hypoxia: Secondary | ICD-10-CM

## 2016-02-19 DIAGNOSIS — F323 Major depressive disorder, single episode, severe with psychotic features: Secondary | ICD-10-CM

## 2016-02-19 DIAGNOSIS — I2510 Atherosclerotic heart disease of native coronary artery without angina pectoris: Secondary | ICD-10-CM | POA: Diagnosis not present

## 2016-02-19 DIAGNOSIS — I10 Essential (primary) hypertension: Secondary | ICD-10-CM | POA: Diagnosis not present

## 2016-02-19 DIAGNOSIS — I4891 Unspecified atrial fibrillation: Secondary | ICD-10-CM | POA: Diagnosis not present

## 2016-02-19 DIAGNOSIS — F0150 Vascular dementia without behavioral disturbance: Secondary | ICD-10-CM | POA: Diagnosis not present

## 2016-04-21 DIAGNOSIS — F329 Major depressive disorder, single episode, unspecified: Secondary | ICD-10-CM

## 2016-04-21 DIAGNOSIS — I480 Paroxysmal atrial fibrillation: Secondary | ICD-10-CM

## 2016-04-21 DIAGNOSIS — I2510 Atherosclerotic heart disease of native coronary artery without angina pectoris: Secondary | ICD-10-CM

## 2016-04-21 DIAGNOSIS — I10 Essential (primary) hypertension: Secondary | ICD-10-CM

## 2016-04-21 DIAGNOSIS — F0150 Vascular dementia without behavioral disturbance: Secondary | ICD-10-CM

## 2016-05-03 DIAGNOSIS — R05 Cough: Secondary | ICD-10-CM | POA: Diagnosis not present

## 2016-05-11 ENCOUNTER — Ambulatory Visit: Payer: Medicare Other | Admitting: Internal Medicine

## 2016-05-30 DIAGNOSIS — K143 Hypertrophy of tongue papillae: Secondary | ICD-10-CM | POA: Diagnosis not present

## 2016-06-05 ENCOUNTER — Telehealth: Payer: Self-pay | Admitting: Family Medicine

## 2016-06-05 NOTE — Telephone Encounter (Signed)
Call from Tom at [facility]- pt is actively dying and they would like an order for Roxanol for pain and dyspnea.  She has an unknown allergy to hydrocodone and codiene listed on their records (this is not listed in Epic), we do not know what her reaction here might be.  However she was recently discharged from hospital (this past spring) with an rx for prn morphine for pain and is actively dying so will go ahead and use morphine.  Gave verbal order for 0.25 to 0.5 ml of roxanol 20mg /ml equivalent morphine concentrate every hour as needed for pain and dyspnea.

## 2016-06-06 NOTE — Telephone Encounter (Signed)
Have asked hospice to come back in to help with her care

## 2016-06-06 NOTE — Telephone Encounter (Signed)
PLEASE NOTE: All timestamps contained within this report are represented as [place] Standard Time. CONFIDENTIALTY NOTICE: This fax transmission is intended only for the addressee. It contains information that is legally privileged, confidential or otherwise protected from use or disclosure. If you are not the intended recipient, you are strictly prohibited from reviewing, disclosing, copying using or disseminating any of this information or taking any action in reliance on or regarding this information. If you have received this fax in error, please notify us immediately by telephone so that we can arrange for its return to us. Phone: [phone], Toll-Free: [phone], Fax: [phone] Page: 1 of 1 Call Id: [id number] [facility] - Client Nonclinical Telephone Record [facility] Client [facility] - [facility] - Night Physician Tillman AbideLetvak, Richard - MD Contact Type Call Who Is Calling Physician / Provider / Hospital Call Type Provider Call [facility] Now Reason for Call Request to speak to Physician Initial Comment [facility]er is [facility] is Alphonsus SiasLetvak. Pt is Destiny Gutierrez [birth year] CB# [phone]. She is actively dying. Need prescription for Codeine. Additional Comment Patient Name Destiny Gutierrez Patient DOB [birth year] [facility] Phys[facility] Number [phone] [facility] Paging [facility] Phone DateTime Result/Outcome Message Type Notes Abbe AmsterdamCopland, Jessica- MD [phone] 06/05/2016 10:44:38 PM Called On Call Provider - Reached Doctor Paged Abbe Amsterdamopland, Jessica- MD 06/05/2016 10:44:54 PM Spoke with On Call - General Message Result Call Closed By: De Hollingsheadaniel Goldston Transaction Date/Time: 06/05/2016 10:39:53 PM (ET)

## 2016-06-07 ENCOUNTER — Telehealth: Payer: Self-pay | Admitting: Internal Medicine

## 2016-06-08 NOTE — Telephone Encounter (Signed)
I got the message She was at the hospice home after significant decline

## 2016-06-10 NOTE — Telephone Encounter (Signed)
Hospice called to let Dr.Letvak know patient passed away today at 5:30 am.

## 2016-06-10 DEATH — deceased

## 2017-05-06 IMAGING — CR DG CHEST 1V PORT
1 series · 1 of 1 positions shown · non-contrast
Comparison: None

CLINICAL DATA: Altered mental status.

EXAM:
PORTABLE CHEST 1 VIEW

[ap]
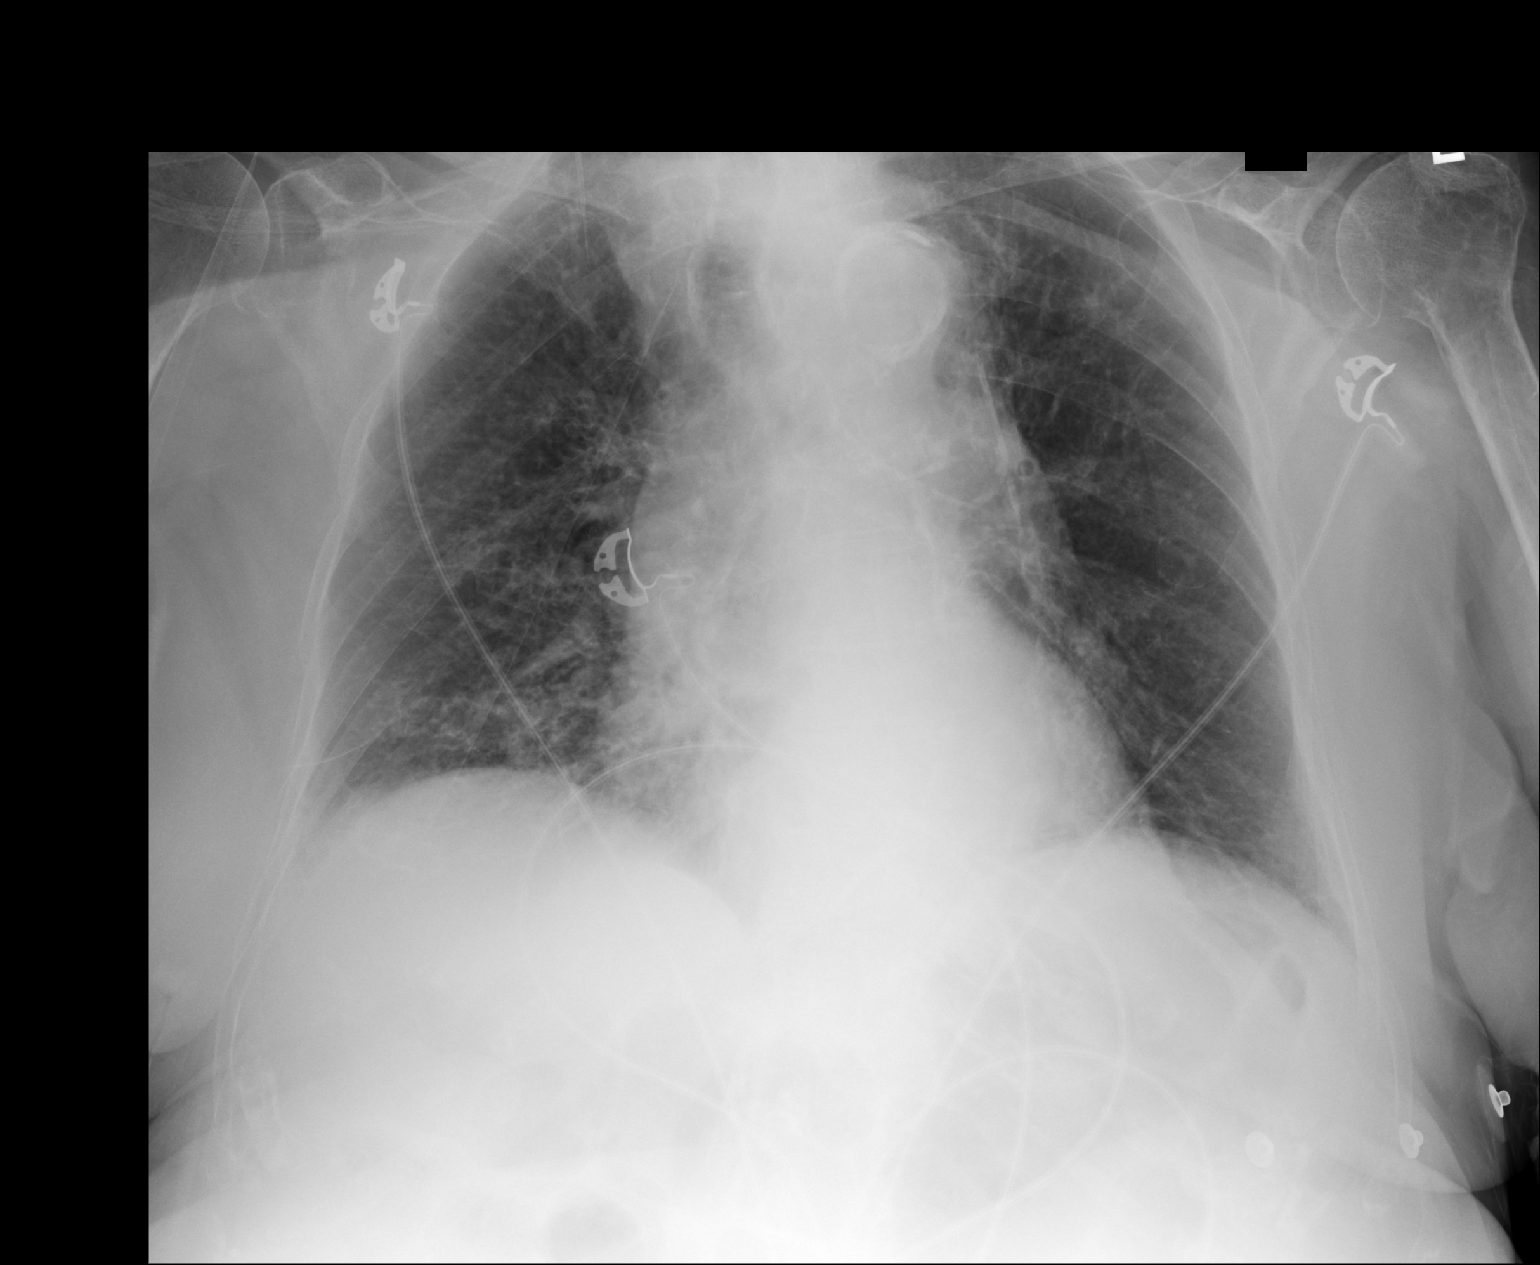

[1 of 1 positions shown; findings below may reference images not displayed]

FINDINGS: No pneumothorax. The heart, hila, and mediastinum are unremarkable
given portable technique. No pulmonary nodules, masses, or
infiltrates.
IMPRESSION: No active disease.

## 2017-05-07 IMAGING — DX DG CHEST 1V
1 series · 1 of 1 positions shown · non-contrast
Comparison: 09/19/2015

CLINICAL DATA: Shortness of breath, sepsis, possible aspiration
pneumonia

EXAM:
CHEST 1 VIEW

[chest ap]
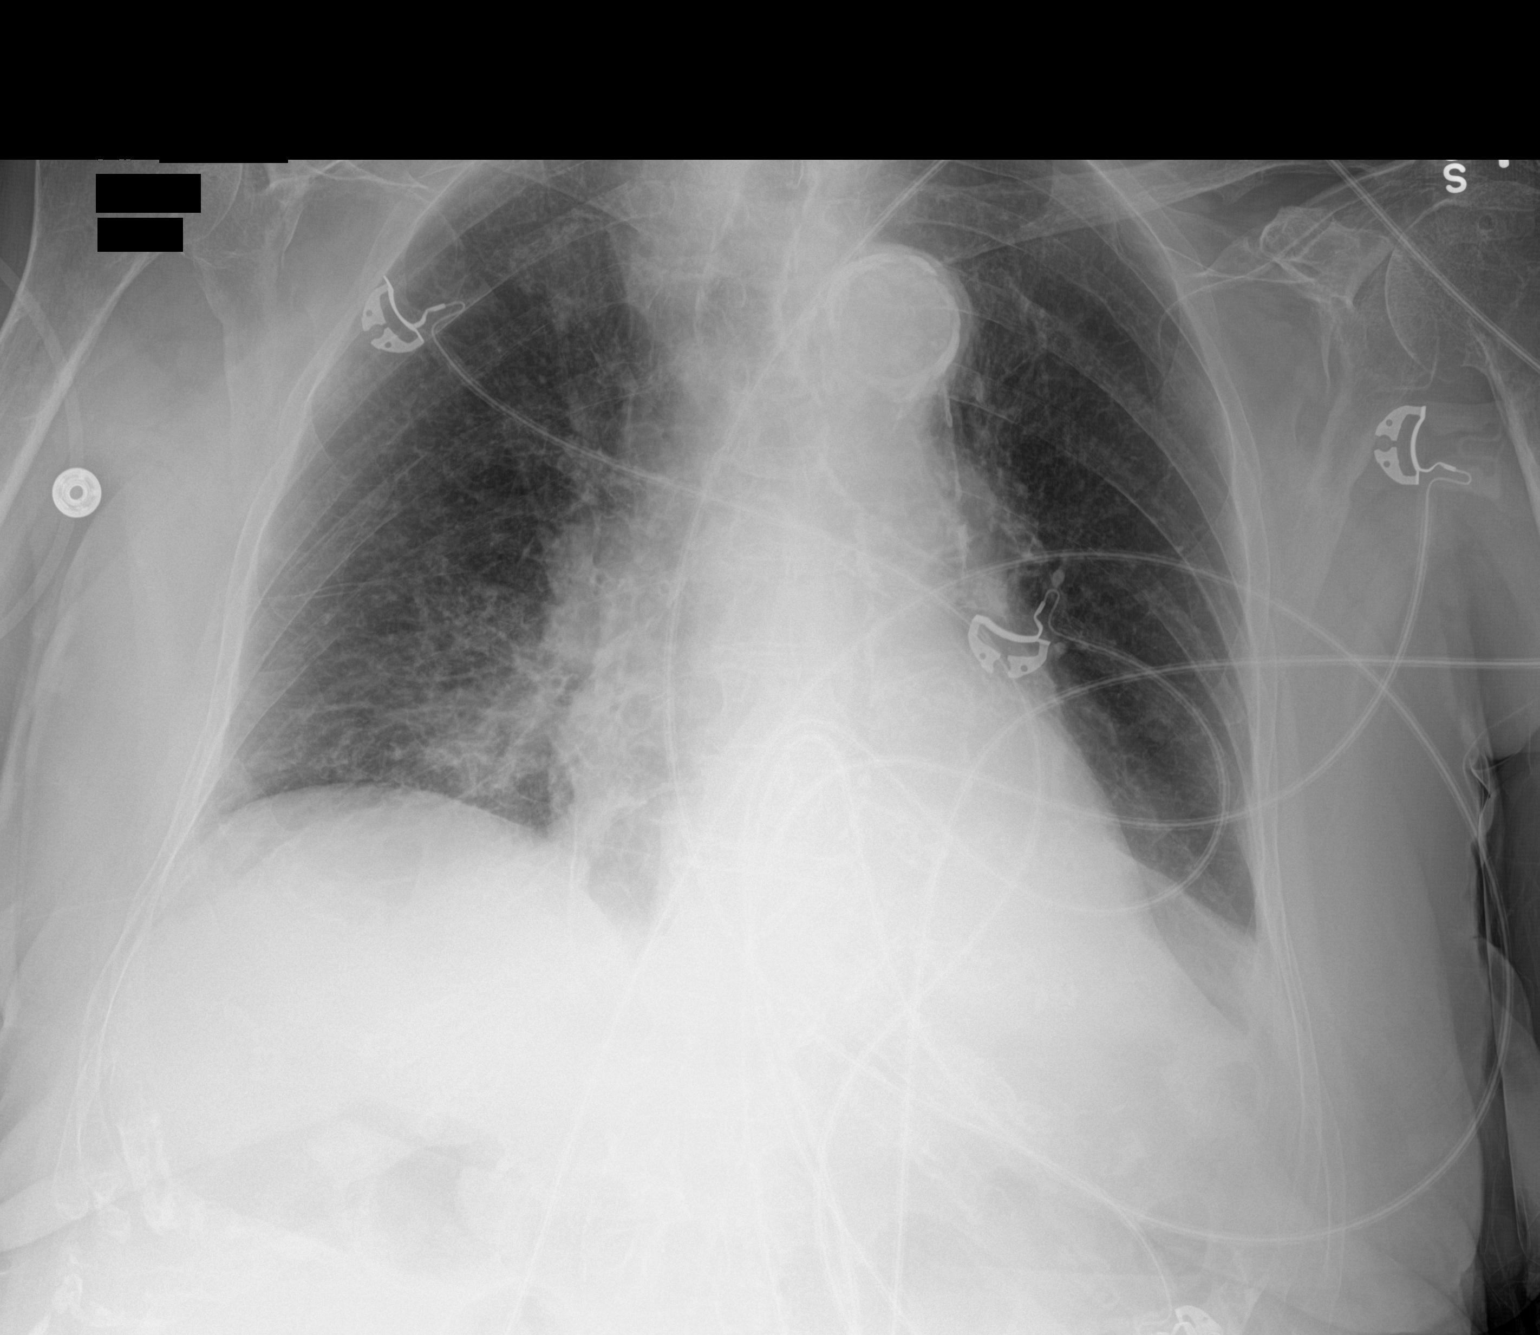

[1 of 1 positions shown; findings below may reference images not displayed]

FINDINGS: Mild patchy right infrahilar opacity, increased, suspicious for
pneumonia. No pleural effusion or pneumothorax.

The heart is top-normal in size.

Mild prominence of the right paratracheal stripe is favored to be
vascular.
IMPRESSION: Mild patchy right infrahilar opacity, increased, suspicious for
pneumonia.

## 2017-05-07 IMAGING — DX DG HAND COMPLETE 3+V*R*
3 series · 3 of 3 positions shown · non-contrast
Comparison: None.

CLINICAL DATA: Sepsis.  Swelling of the right hand.

EXAM:
RIGHT HAND - COMPLETE 3+ VIEW

[hand ap]
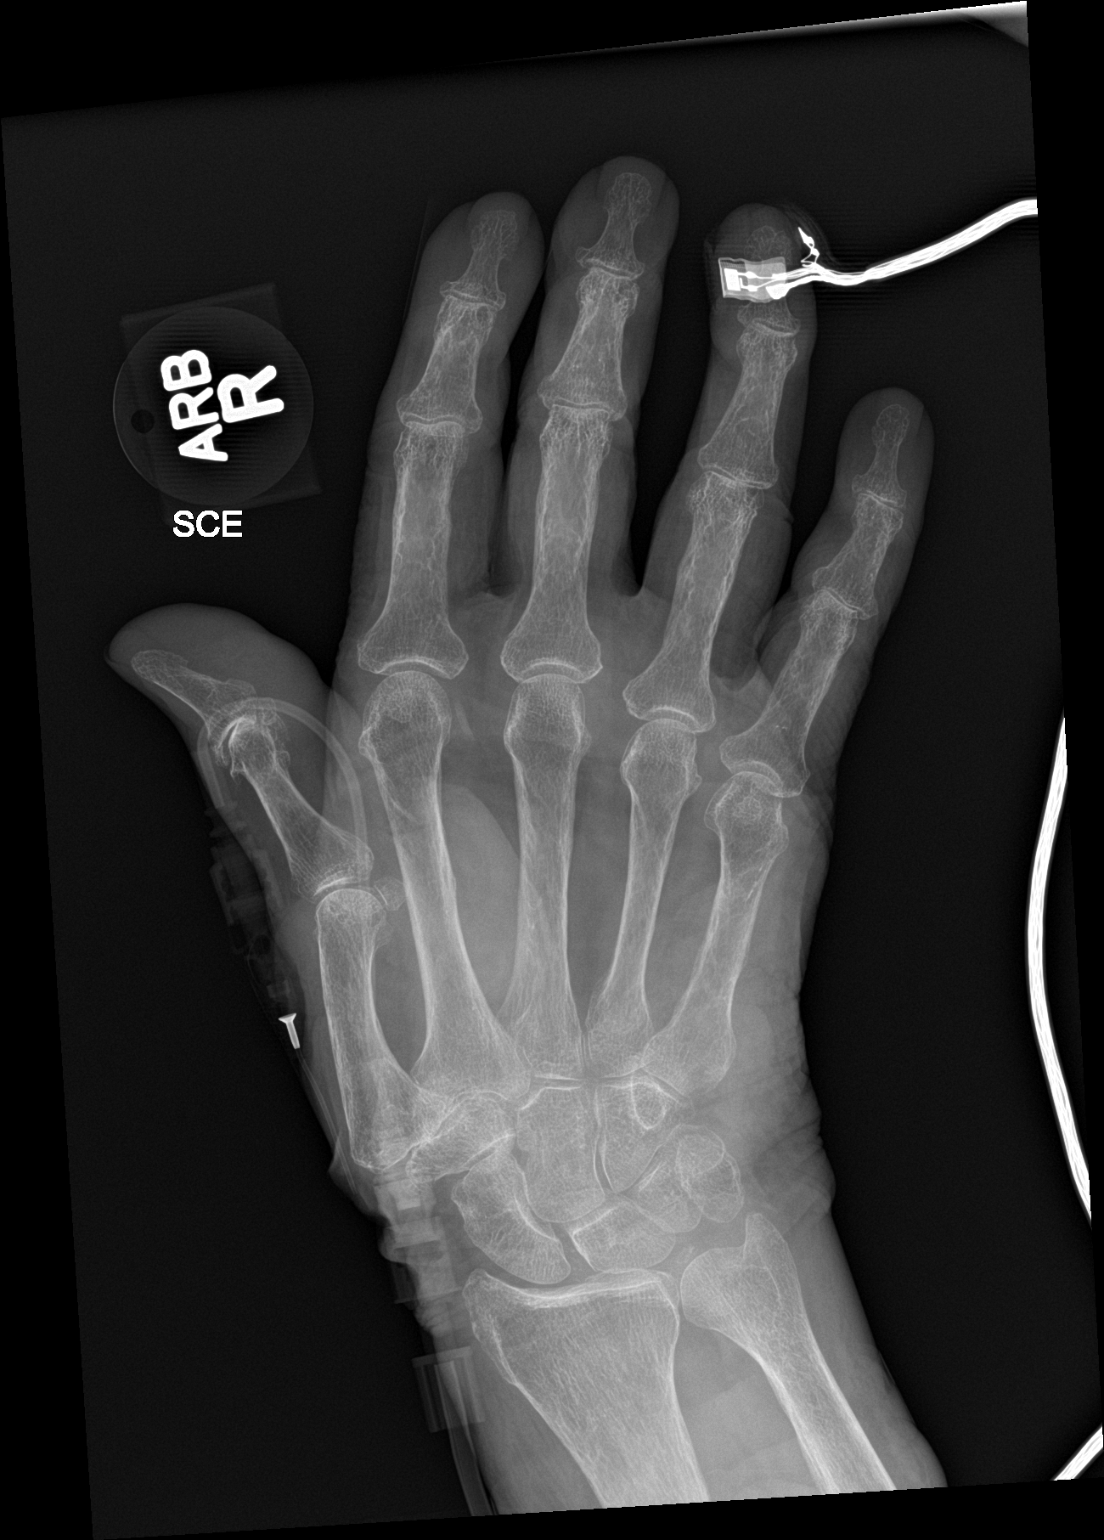

[hand obl]
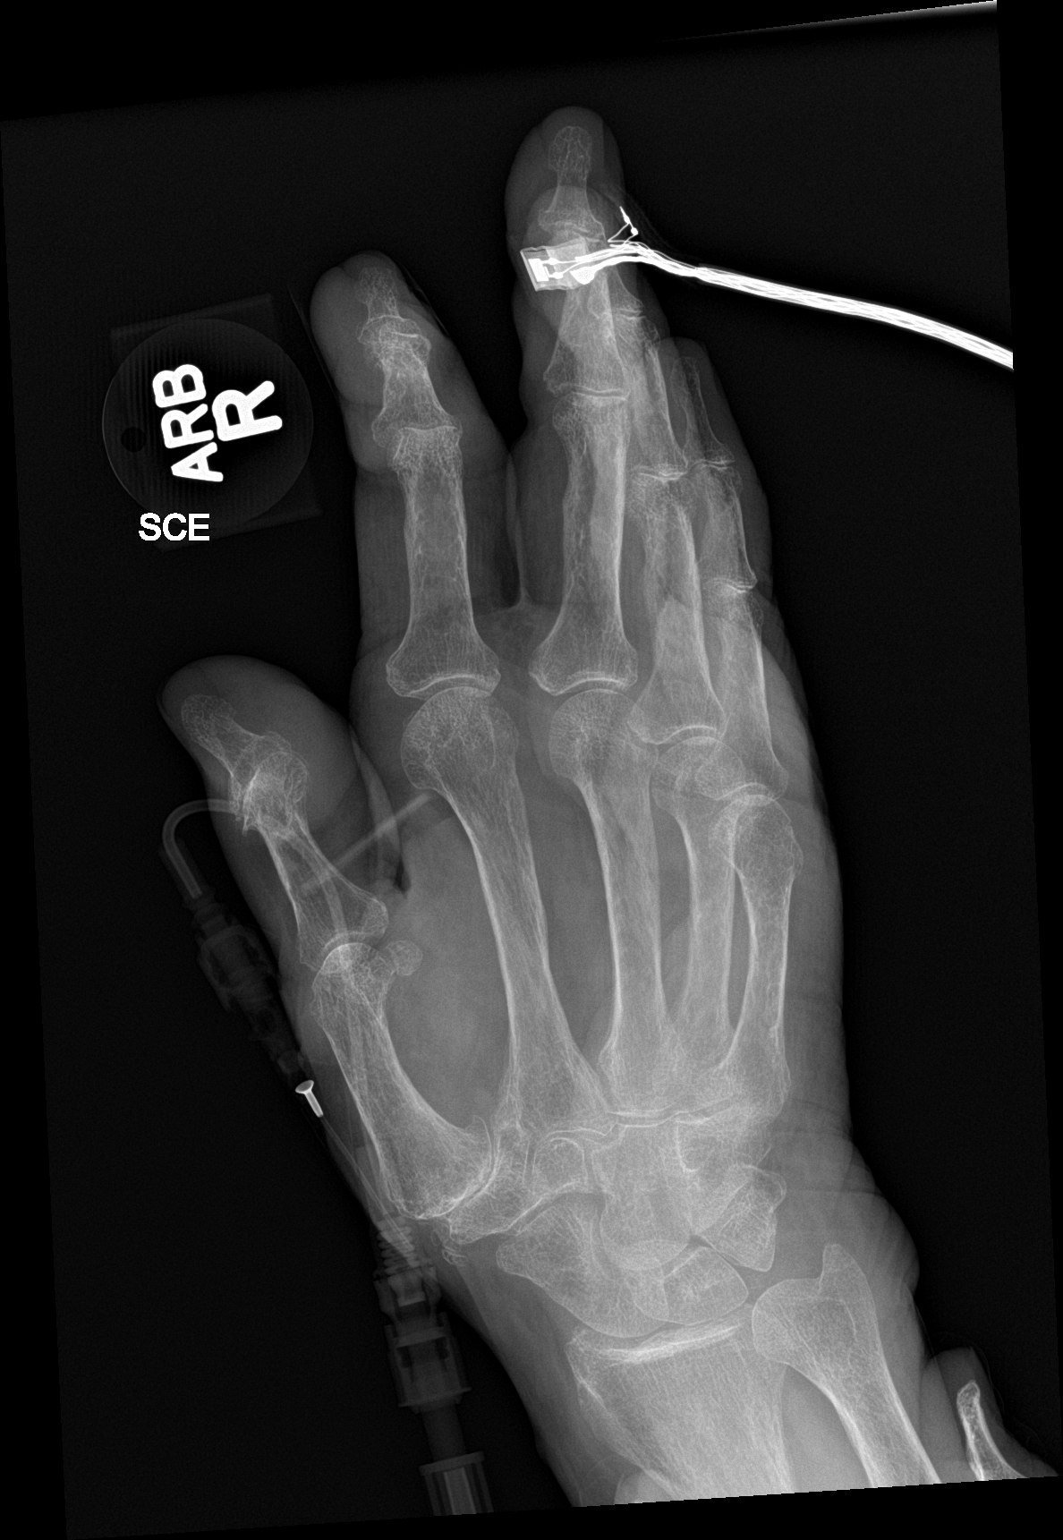

[hand lat]
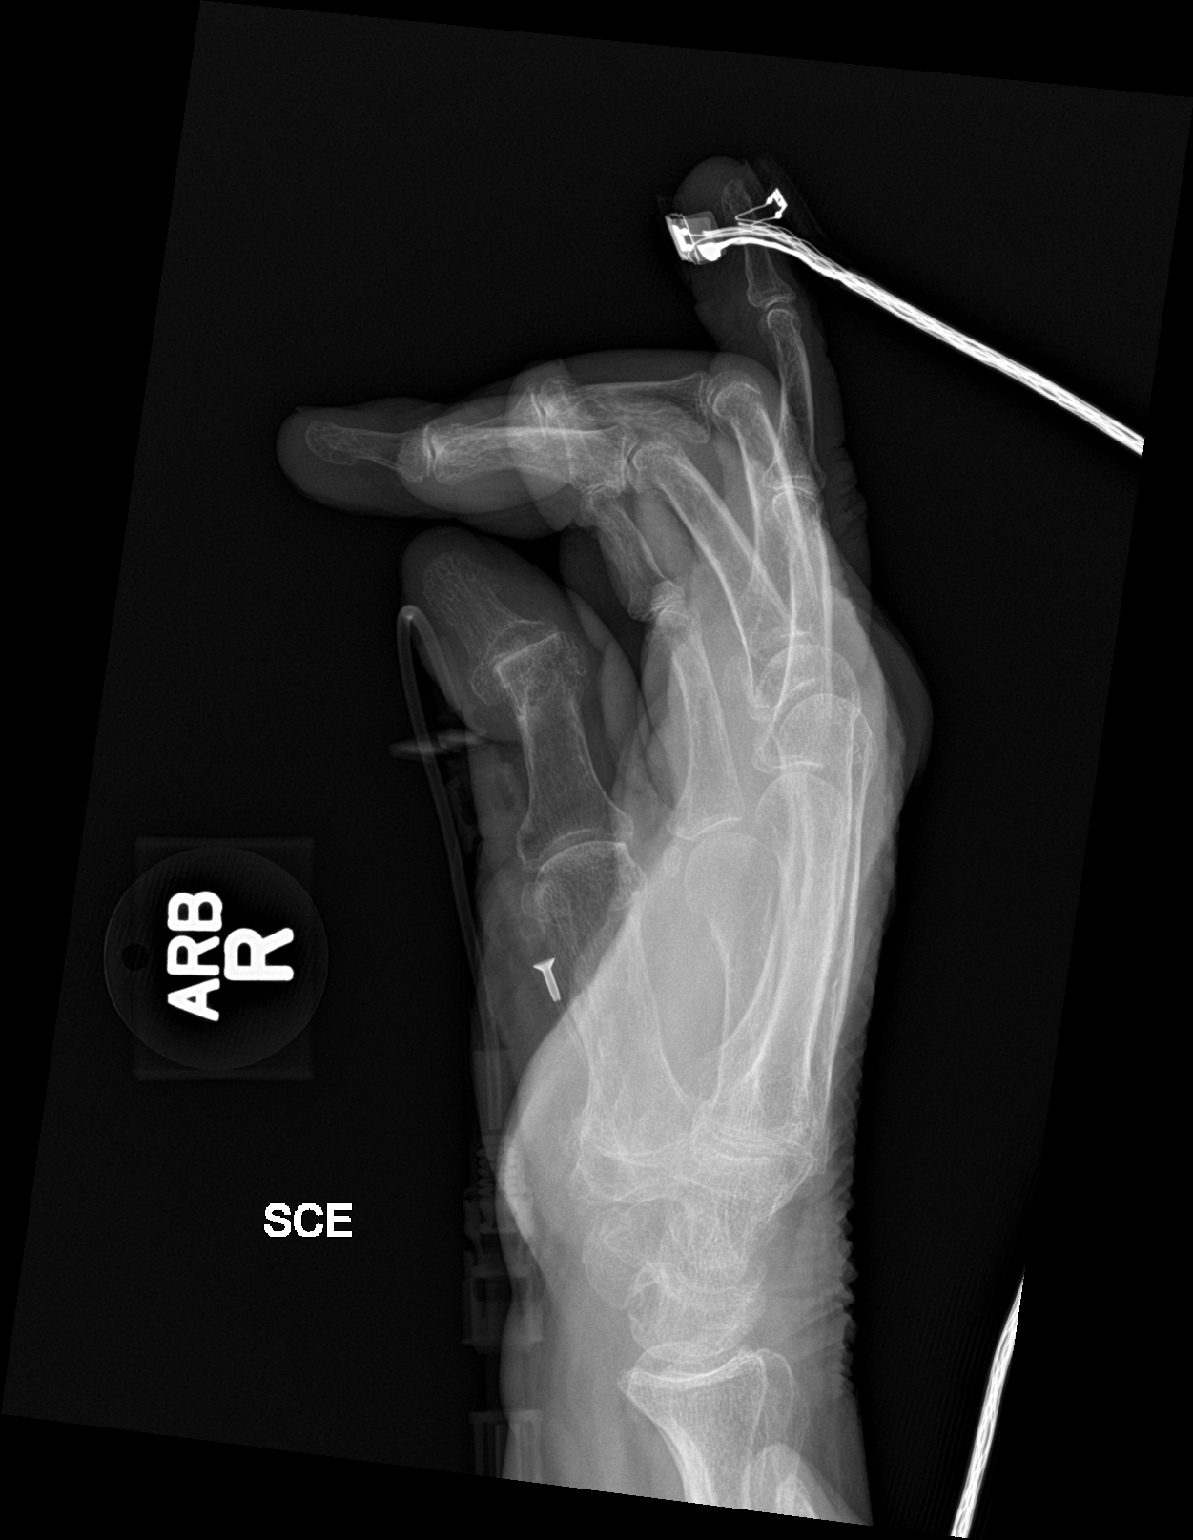

[3 of 3 positions shown; findings below may reference images not displayed]

FINDINGS: An IV is present at the base of the first metacarpal. Oxygen
saturation device is present at the distal ring finger.

Moderate osteopenia is present. Soft tissue swelling is present over
the dorsum of the hand. There is no underlying fracture or
dislocation. No osseous destruction is present. Degenerative changes
are present in the DIP joints and at the first CMC joint.
IMPRESSION: 1. Soft tissue swelling over the dorsum the hand at the MCP joints.
2. No acute or focal osseous abnormality subjacent.
3. Moderate osteoarthritic changes.

## 2017-05-08 IMAGING — DX DG CHEST 1V PORT
1 series · 1 of 1 positions shown · non-contrast
Comparison: September 21, 2015.

CLINICAL DATA: Acute hypoxic respiratory failure. Aspiration
pneumonia.

EXAM:
PORTABLE CHEST 1 VIEW

[chest ap]
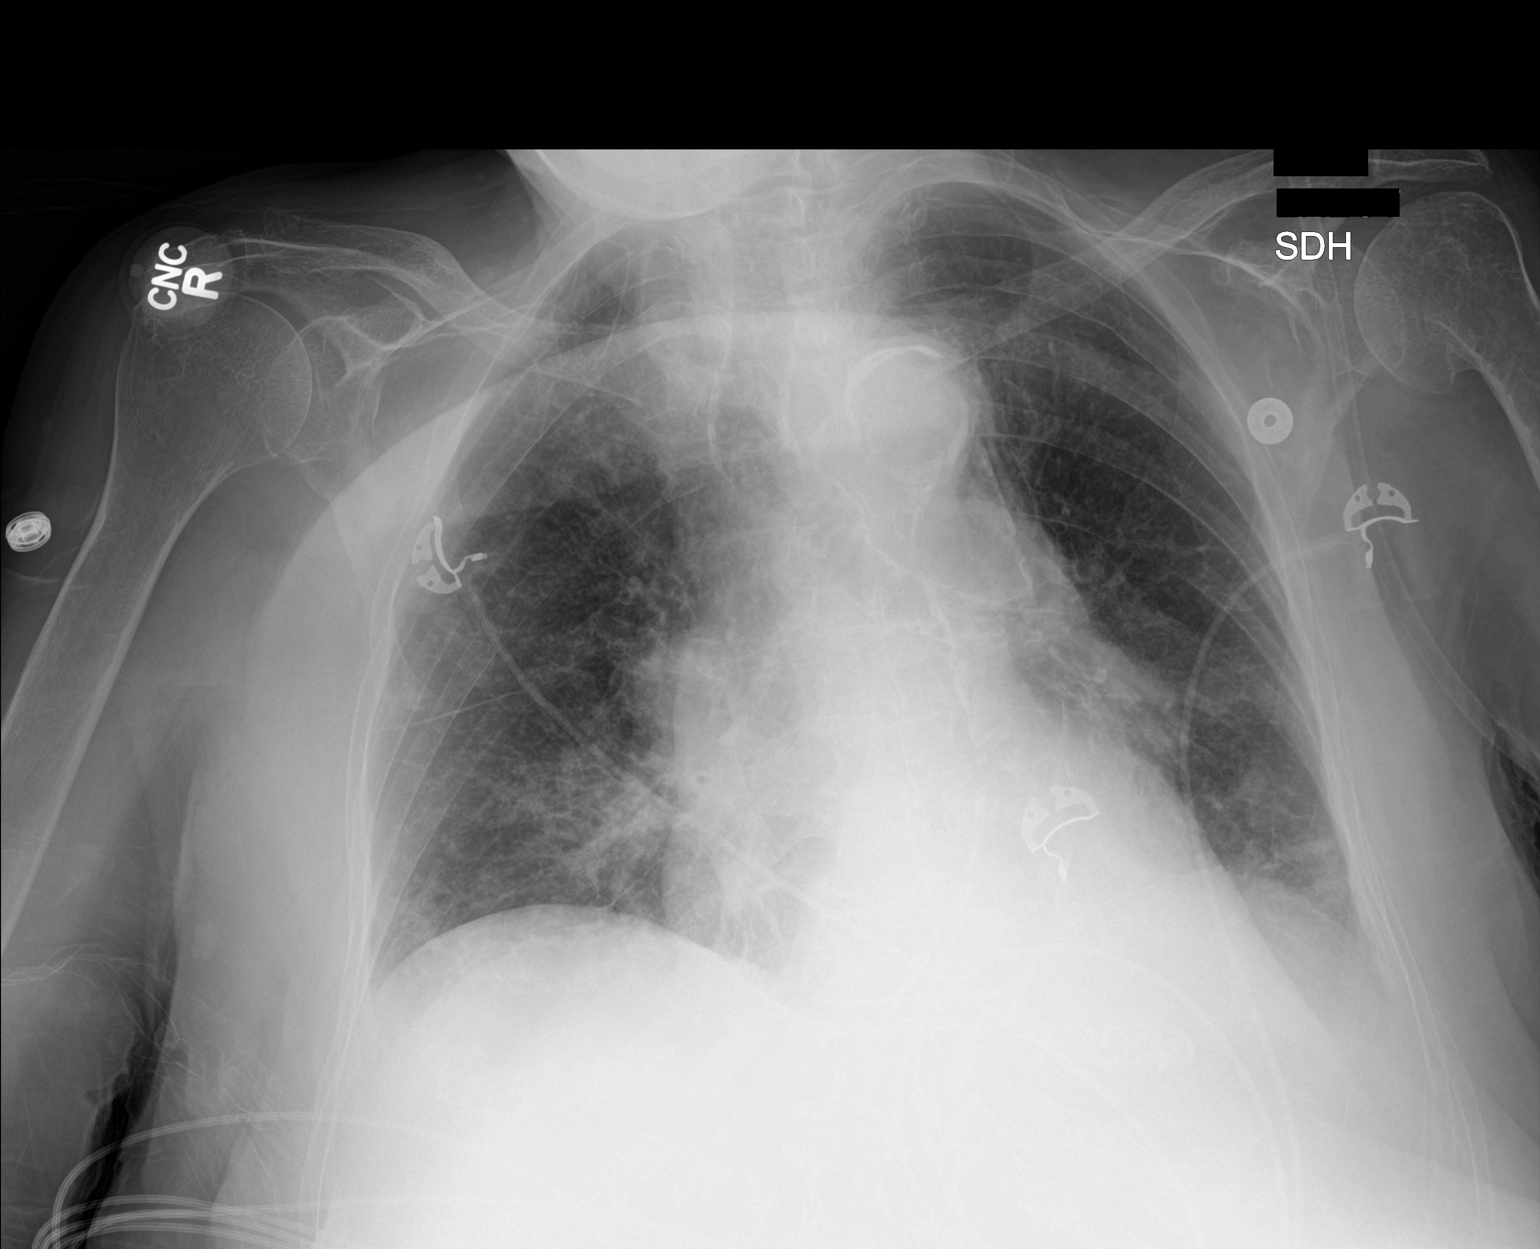

[1 of 1 positions shown; findings below may reference images not displayed]

FINDINGS: Stable cardiomediastinal silhouette. No pneumothorax is noted. No
significant pleural effusion is noted. Atherosclerosis of descending
thoracic aorta is noted. Stable right infrahilar opacity is noted
concerning for possible pneumonia. Mildly increased left basilar
subsegmental atelectasis is noted. Bony thorax is unremarkable.
IMPRESSION: Stable right infrahilar opacity is noted concerning for pneumonia.
Mildly increased left basilar subsegmental atelectasis is noted.
